# Patient Record
Sex: Male | Born: 1975
Health system: Southern US, Community
[De-identification: ages and names within clinical notes are randomized; demographics above are authoritative.]

## PROBLEM LIST (undated history)

## (undated) HISTORY — PX: EYE SURGERY: SHX253

---

## 2001-12-28 ENCOUNTER — Emergency Department (HOSPITAL_COMMUNITY): Admission: EM | Admit: 2001-12-28 | Discharge: 2001-12-28 | Payer: Self-pay

## 2008-08-14 ENCOUNTER — Emergency Department (HOSPITAL_COMMUNITY): Admission: EM | Admit: 2008-08-14 | Discharge: 2008-08-14 | Payer: Self-pay | Admitting: Emergency Medicine

## 2008-08-17 ENCOUNTER — Emergency Department (HOSPITAL_COMMUNITY): Admission: EM | Admit: 2008-08-17 | Discharge: 2008-08-17 | Payer: Self-pay | Admitting: Emergency Medicine

## 2010-02-21 ENCOUNTER — Emergency Department (HOSPITAL_COMMUNITY): Admission: EM | Admit: 2010-02-21 | Discharge: 2010-02-21 | Payer: Self-pay | Admitting: Emergency Medicine

## 2010-12-29 LAB — URINALYSIS, ROUTINE W REFLEX MICROSCOPIC
Bilirubin Urine: NEGATIVE
Glucose, UA: NEGATIVE mg/dL
Ketones, ur: NEGATIVE mg/dL
Leukocytes, UA: NEGATIVE
Nitrite: NEGATIVE
Protein, ur: NEGATIVE mg/dL
Specific Gravity, Urine: 1.013 (ref 1.005–1.030)
Urobilinogen, UA: 1 mg/dL (ref 0.0–1.0)
pH: 6.5 (ref 5.0–8.0)

## 2010-12-29 LAB — URINE MICROSCOPIC-ADD ON

## 2010-12-29 LAB — HEMOCCULT GUIAC POC 1CARD (OFFICE): Fecal Occult Bld: NEGATIVE

## 2011-07-13 LAB — URINALYSIS, ROUTINE W REFLEX MICROSCOPIC
Bilirubin Urine: NEGATIVE
Glucose, UA: NEGATIVE
Ketones, ur: NEGATIVE
Leukocytes, UA: NEGATIVE
Nitrite: NEGATIVE
Protein, ur: NEGATIVE
Specific Gravity, Urine: 1.008
Urobilinogen, UA: 0.2
pH: 6

## 2011-07-13 LAB — URINE MICROSCOPIC-ADD ON

## 2011-09-06 ENCOUNTER — Emergency Department (HOSPITAL_COMMUNITY)
Admission: EM | Admit: 2011-09-06 | Discharge: 2011-09-06 | Disposition: A | Discharge status: Home / Self Care | Payer: Self-pay | Source: Home / Self Care | Attending: Family Medicine | Admitting: Family Medicine

## 2011-09-06 DIAGNOSIS — S43409A Unspecified sprain of unspecified shoulder joint, initial encounter: Secondary | ICD-10-CM

## 2011-09-06 DIAGNOSIS — IMO0002 Reserved for concepts with insufficient information to code with codable children: Secondary | ICD-10-CM

## 2011-09-06 MED ORDER — PREDNISONE (PAK) 10 MG PO TABS: ORAL_TABLET | ORAL | Status: DC

## 2011-09-06 NOTE — ED Provider Notes (Signed)
History     CSN: [id number] Arrival date & time: 09/06/2011  2:50 PM   First MD Initiated Contact with Patient 09/06/11 1403      Chief Complaint  Patient presents with  . Shoulder Pain    (Consider location/radiation/quality/duration/timing/severity/associated sxs/prior treatment) Patient is a 35 y.o. male presenting with shoulder pain. The history is provided by the patient.  Shoulder Pain This is a new problem. The current episode started more than 1 week ago. The problem occurs constantly. The problem has not changed since onset. pain worse with certain motion. No numbness or tingling. No swelling. Denies specific injury.   History reviewed. No pertinent past medical history.  History reviewed. No pertinent past surgical history.  History reviewed. No pertinent family history.  History  Substance Use Topics  . Smoking status: Current Everyday Smoker -- 0.5 packs/day  . Smokeless tobacco: Not on file  . Alcohol Use: Yes     occasional      Review of Systems  Constitutional: Negative.   HENT: Negative.   Eyes: Negative.   Respiratory: Negative.   Cardiovascular: Negative.   Gastrointestinal: Negative.   Genitourinary: Negative.   Neurological: Negative.     Allergies  Review of patient's allergies indicates no known allergies.  Home Medications   Current Outpatient Rx  Name Route Sig Dispense Refill  . PREDNISONE (PAK) 10 MG PO TABS  6 day pack , take as directed with food 21 tablet 0    BP 142/87  Pulse 74  Temp(Src) 98.5 F (36.9 C) (Oral)  Resp 16  SpO2 97%  Physical Exam  Nursing note and vitals reviewed. Constitutional: He appears well-developed and well-nourished. No distress.  HENT:  Head: Normocephalic.  Neck: Normal range of motion. Neck supple.  Cardiovascular: Normal rate and regular rhythm.   Pulmonary/Chest: Effort normal and breath sounds normal.  Musculoskeletal:       Pain over the bicipital groove righ humerous. Pain with  supination of the forearm and flexion of the forearm. N/v intact.     ED Course  Procedures (including critical care time)  Labs Reviewed - No data to display No results found.   1. Shoulder sprain       MDM          Randa Spike, MD 09/06/11 [phone]

## 2011-09-06 NOTE — ED Notes (Signed)
C/o pain and limited ROM in rt shoulder for 2 months.  Denies known injury.  States sx worse in last few days.  Reports similar sx before but they always resolve.

## 2011-09-18 ENCOUNTER — Encounter (HOSPITAL_COMMUNITY): Payer: Self-pay | Admitting: *Deleted

## 2011-09-18 ENCOUNTER — Emergency Department (HOSPITAL_COMMUNITY)
Admission: EM | Admit: 2011-09-18 | Discharge: 2011-09-18 | Disposition: A | Discharge status: Home / Self Care | Payer: Self-pay | Attending: Emergency Medicine | Admitting: Emergency Medicine

## 2011-09-18 DIAGNOSIS — K648 Other hemorrhoids: Secondary | ICD-10-CM | POA: Insufficient documentation

## 2011-09-18 DIAGNOSIS — K6289 Other specified diseases of anus and rectum: Secondary | ICD-10-CM | POA: Insufficient documentation

## 2011-09-18 MED ORDER — HYDROCORTISONE ACETATE 25 MG RE SUPP: 25.0000 mg | Freq: Two times a day (BID) | RECTAL | Status: DC

## 2011-09-18 MED ORDER — PRAMOXINE HCL 1 % RE FOAM: RECTAL | Status: DC | PRN

## 2011-09-18 MED ORDER — OXYCODONE-ACETAMINOPHEN 5-325 MG PO TABS: 1.0000 | ORAL_TABLET | ORAL | Status: AC | PRN

## 2011-09-18 MED ORDER — OXYCODONE-ACETAMINOPHEN 5-325 MG PO TABS
1.0000 | ORAL_TABLET | Freq: Once | ORAL | Status: AC
  Administered 2011-09-18: 1 via ORAL
  Filled 2011-09-18: qty 1

## 2011-09-18 NOTE — ED Provider Notes (Signed)
History     CSN: [id number] Arrival date & time: 09/18/2011  9:19 AM   First MD Initiated Contact with Patient 09/18/11 1014      Chief Complaint  Patient presents with  . Hemorrhoids    (Consider location/radiation/quality/duration/timing/severity/associated sxs/prior treatment) The history is provided by the patient.  Patient presents with rectal pain which started 3-4 days ago. States he has had hemorrhoids in the past and this feels similar; feels as if he has an internal, left sided hemorrhoid. He was apparently evaluated several years ago for this and had a questionably enlarged prostate; he was seen by [facility], where he was told his prostate is not enlarged.   He has not noted any gross blood per rectum. Pain worsens with BMs; last movement was this AM and was normal for him. Pt states he eats a good amount of fiber in his diet and usually has daily BMs. Has been using OTC Dulcolax suppositories for the past few days which have not been helping much. Denies abd pain, nausea, vomiting, diarrhea.  Past Medical History  Diagnosis Date  . Hemorrhoids     History reviewed. No pertinent past surgical history.  No family history on file.  History  Substance Use Topics  . Smoking status: Current Everyday Smoker -- 0.5 packs/day  . Smokeless tobacco: Not on file  . Alcohol Use: Yes     occasional      Review of Systems  Constitutional: Negative.   Respiratory: Negative for shortness of breath.   Cardiovascular: Negative for chest pain.  Gastrointestinal: Positive for rectal pain. Negative for nausea, vomiting, abdominal pain, diarrhea, constipation, blood in stool, abdominal distention and anal bleeding.  Genitourinary: Negative for dysuria, decreased urine volume, scrotal swelling, difficulty urinating and testicular pain.  Musculoskeletal: Negative for myalgias and arthralgias.  Skin: Negative for color change and rash.  Neurological: Negative for dizziness and  weakness.    Allergies  Review of patient's allergies indicates no known allergies.  Home Medications   Current Outpatient Rx  Name Route Sig Dispense Refill  . BISACODYL 10 MG RE SUPP Rectal Place 10 mg rectally as needed. constipation     . IBUPROFEN 200 MG PO TABS Oral Take 200 mg by mouth every 6 (six) hours as needed. pain       BP 124/68  Pulse 80  Temp 98.9 F (37.2 C)  Resp 20  SpO2 97%  Physical Exam  Nursing note and vitals reviewed. Constitutional: He is oriented to person, place, and time. He appears well-developed and well-nourished. No distress.       Pt lying on left side, appears uncomfortable  HENT:  Head: Normocephalic and atraumatic.  Right Ear: External ear normal.  Left Ear: External ear normal.  Neck: Normal range of motion.  Cardiovascular: Normal rate, regular rhythm and normal heart sounds.   Pulmonary/Chest: Effort normal and breath sounds normal. No respiratory distress.  Abdominal: Soft. Bowel sounds are normal. He exhibits no distension and no mass. There is no tenderness. There is no rebound and no guarding.  Genitourinary: Rectal exam shows internal hemorrhoid and tenderness. Rectal exam shows no external hemorrhoid, no fissure, no mass and anal tone normal. Guaiac negative stool.  Neurological: He is alert and oriented to person, place, and time.  Skin: Skin is warm and dry. No rash noted. He is not diaphoretic.    ED Course  Procedures (including critical care time)  Labs Reviewed - No data to display No results found. Hemoccult  results not crossing over, but negative per tech  1. Internal hemorrhoids without complication       MDM  Internal hemorrhoids on rectal exam; exam painful. Pt will be treated symptomatically. Encouraged him to push high fiber diet and fluids over next several days; will tx with proctofoam and anusol. Discussed reasons to return to ED. Pt verbalized understanding and agreed to plan.        Grant Fontana, [place] 09/18/11 [phone]

## 2011-09-18 NOTE — ED Notes (Signed)
Pt has history of hemorrhoids, states they feel internal on left side, last BM this am

## 2011-09-20 LAB — OCCULT BLOOD, POC DEVICE: Fecal Occult Bld: NEGATIVE

## 2011-09-20 NOTE — ED Provider Notes (Signed)
Medical screening examination/treatment/procedure(s) were performed by non-physician practitioner and as supervising physician I was immediately available for consultation/collaboration.  Shelda Jakes, MD 09/20/11 [phone]

## 2011-09-21 ENCOUNTER — Encounter (HOSPITAL_COMMUNITY): Payer: Self-pay | Admitting: Emergency Medicine

## 2011-09-21 ENCOUNTER — Emergency Department (HOSPITAL_COMMUNITY)
Admission: EM | Admit: 2011-09-21 | Discharge: 2011-09-22 | Discharge status: Against Medical Advice | Payer: Self-pay | Attending: Emergency Medicine | Admitting: Emergency Medicine

## 2011-09-21 DIAGNOSIS — K612 Anorectal abscess: Secondary | ICD-10-CM

## 2011-09-21 DIAGNOSIS — K610 Anal abscess: Secondary | ICD-10-CM | POA: Diagnosis present

## 2011-09-21 MED ORDER — MORPHINE SULFATE 2 MG/ML IJ SOLN
INTRAMUSCULAR | Status: AC
  Filled 2011-09-21: qty 1

## 2011-09-21 MED ORDER — LIDOCAINE HCL 1 % IJ SOLN
INTRAMUSCULAR | Status: AC
  Filled 2011-09-21: qty 20

## 2011-09-21 MED ORDER — IBUPROFEN 200 MG PO TABS: 400.0000 mg | ORAL_TABLET | Freq: Four times a day (QID) | ORAL | Status: DC | PRN

## 2011-09-21 MED ORDER — HYDROMORPHONE HCL PF 1 MG/ML IJ SOLN: 1.0000 mg | Freq: Once | INTRAMUSCULAR | Status: DC

## 2011-09-21 MED ORDER — LIDOCAINE-EPINEPHRINE 1 %-1:100000 IJ SOLN
10.0000 mL | Freq: Once | INTRAMUSCULAR | Status: DC
  Filled 2011-09-21: qty 10

## 2011-09-21 MED ORDER — MORPHINE SULFATE 2 MG/ML IJ SOLN
2.0000 mg | Freq: Once | INTRAMUSCULAR | Status: AC
  Administered 2011-09-21: 2 mg via INTRAVENOUS

## 2011-09-21 MED ORDER — LIDOCAINE-EPINEPHRINE 1 %-1:100000 IJ SOLN
20.0000 mL | Freq: Once | INTRAMUSCULAR | Status: DC
  Filled 2011-09-21: qty 20

## 2011-09-21 MED ORDER — HYDROMORPHONE HCL PF 2 MG/ML IJ SOLN
2.0000 mg | Freq: Once | INTRAMUSCULAR | Status: AC
  Administered 2011-09-21: 2 mg via INTRAVENOUS
  Filled 2011-09-21: qty 1

## 2011-09-21 MED ORDER — OXYCODONE-ACETAMINOPHEN 5-325 MG PO TABS: 1.0000 | ORAL_TABLET | ORAL | Status: DC | PRN

## 2011-09-21 NOTE — Op Note (Signed)
NAMERORAN, WEGNER NO.:  [id number]  MEDICAL RECORD NO.:  [id number]  LOCATION:  WOTF                         FACILITY:  [facility]  PHYSICIAN:  Anselm Pancoast. Weatherly, M.D.DATE OF BIRTH:  01/16/1976  DATE OF PROCEDURE:  09/21/2011 DATE OF DISCHARGE:                              OPERATIVE REPORT   PREOPERATIVE DIAGNOSIS:  Perianal abscess.  POSTOPERATIVE DIAGNOSIS:  Perianal abscess.Marland Kitchen  PROCEDURE PERFORMED:  Incision and drainage of perianal abscess.  PROVIDER:  Brayton El, PA-C/William J. Zachery Dakins, M.D.  ANESTHESIA:  2% lidocaine local.  DESCRIPTION OF PROCEDURE:  Mr. Loman is a 35 year old gentleman, who was seen in the emergency room for rectal pain.  He was noted to have a large perianal abscess.  After informed consent was obtained, the patient was placed in a jack-knife position for procedure.  Sterile prep and drape was carried out in the usual manner.  2% lidocaine was locally infiltrated into the abscess cavity, achieving adequate anesthesia.  The abscess was then incised with a sterile #11 blade yielding copious amounts of purulent fluid and pus.  Culture was obtained.  The wound was broken up with any septations via sterile technique and more purulence was expressed.  Once it was felt that the abscess was adequately drained, quarter-inch NuGauze packing was placed into the wound and sterile dressing was placed.  The patient tolerated the procedure well. There was minimal blood loss.  Postop instructions have been given to him and the remainder of his treatment plan is outlined in his consultation note.     Brayton El, PA-C   ______________________________ Anselm Pancoast. Zachery Dakins, M.D.    KB/MEDQ  D:  09/21/2011  T:  09/21/2011  Job:  [id number]

## 2011-09-21 NOTE — ED Notes (Signed)
Pt states was seen Sat in ER and told he had hemorrhoids, states no improvement since was seen, states medications"arent working".

## 2011-09-21 NOTE — Consult Note (Signed)
Reason for Consult:Paerianal pain and swelling Referring Physician: Ritch   HPI: Andrew Gates is an 35 y.o. male with c/o anal pain. Denies trauma. Has a history of hemorrhoids. Was recently seen about a little over a week ago for shoulder pain and put on prednisone 6day taper. About the end of his taper, he felt some worsening anal pain. He came to the ER the other night and was treated for an int hemorrhoid. However he has not responded to topical treatment and he feels as if there is more swelling and it has become very painful. He has not noticed any drainage. Denies bleeding. Denies abd pain, fever.  Past Medical History:  Past Medical History  Diagnosis Date  . Hemorrhoids     Surgical History: History reviewed. No pertinent past surgical history.  Family History: History reviewed. No pertinent family history.  Social History:  reports that he has been smoking.  He does not have any smokeless tobacco history on file. He reports that he drinks alcohol. He reports that he does not use illicit drugs.  Allergies: No Known Allergies  Medications: I have reviewed the patient's current medications.  ROS: See HPI for pertinent findings, otherwise complete 10 system review negative.  Physical Exam: Blood pressure 136/78, pulse 105, temperature 98.7 F (37.1 C), temperature source Oral, resp. rate 26, SpO2 100.00%.  General Appearance:  Alert, cooperative, no distress, appears stated age  Lungs:   Clear to auscultation bilaterally, no w/r/r, respirations unlabored without use of accessory muscles.  Chest Wall:  No tenderness or deformity  Heart:  Regular rate and rhythm, S1, S2 normal, no murmur, rub or gallop. Carotids 2+ without bruit.  Abdomen:   Soft, non-tender, non distended. Bowel sounds active all four quadrants,  no masses, no organomegaly.  Rectal:  Obvious external lesion/mass, soft and fluctuant and very tender. Not blue/purple as expected for thrombosed hem.     Labs: CBC No results found for this basename: WBC:2,HGB:2,HCT:2,PLT:2 in the last 72 hours MET No results found for this basename: NA:2,K:2,CL:2,CO2:2,GLUCOSE:2,BUN:2,CREATININE:2,CALCIUM:2 in the last 72 hours No results found for this basename: PROT,ALBUMIN,AST,ALT,ALKPHOS,BILITOT,BILIDIR,IBILI,LIPASE in the last 72 hours PT/INR No results found for this basename: LABPROT:2,INR:2 in the last 72 hours ABG No results found for this basename: PHART:2,PCO2:2,PO2:2,HCO3:2 in the last 72 hours    No results found.  Assessment/Plan: Principal Problem:  *Perianal abscess Needs I&D-done at bedside. See Op Note. Instructions given for soaks, dressing. Bowel regimen. Check cultures, abx.   Marrie Chandra J 09/21/2011, 3:52 PM

## 2011-09-21 NOTE — ED Provider Notes (Signed)
History     CSN: [id number] Arrival date & time: 09/21/2011 12:15 PM   First MD Initiated Contact with Patient 09/21/11 1433      Chief Complaint  Patient presents with  . Rectal Pain   HPI Pt returns today with continued, worsening pain from left sided hemorrhoid.  Patient reports that his pain has been steadily increasing since seen in ER.  Pain is in the rectal area, is causing problems with urination as a result.  No fevers/chills, no nausea/vomiting, no BM since two days ago.  Has been trying to increase fiber intake, use hydrocortisone suppositories and using proctofoam, none of which have been having significant effect.  Past Medical History  Diagnosis Date  . Hemorrhoids     History reviewed. No pertinent past surgical history.  No family history on file.  History  Substance Use Topics  . Smoking status: Current Everyday Smoker -- 0.5 packs/day  . Smokeless tobacco: Not on file  . Alcohol Use: Yes     occasional      Review of Systems  Constitutional: Negative.   HENT: Negative.   Eyes: Negative.   Respiratory: Negative.   Cardiovascular: Negative.   Gastrointestinal: Positive for rectal pain. Negative for vomiting, abdominal pain, diarrhea, constipation, blood in stool and anal bleeding.  Genitourinary: Positive for difficulty urinating. Negative for dysuria, flank pain, discharge, enuresis and penile pain.  Musculoskeletal: Negative.   Skin: Negative.   Neurological: Negative.   Hematological: Negative.     Allergies  Review of patient's allergies indicates no known allergies.  Home Medications   Current Outpatient Rx  Name Route Sig Dispense Refill  . HYDROCORTISONE ACETATE 25 MG RE SUPP Rectal Place 1 suppository (25 mg total) rectally 2 (two) times daily. 12 suppository 0  . IBUPROFEN 200 MG PO TABS Oral Take 400 mg by mouth every 6 (six) hours as needed. For pain    . OXYCODONE-ACETAMINOPHEN 5-325 MG PO TABS Oral Take 1 tablet by mouth every 4  (four) hours as needed for pain. 6 tablet 0  . PRAMOXINE HCL 1 % RE FOAM Rectal Place rectally every 2 (two) hours as needed for hemorrhoids. 15 g 0    BP 136/78  Pulse 105  Temp(Src) 98.7 F (37.1 C) (Oral)  Resp 26  SpO2 100%  Physical Exam  Constitutional: He is oriented to person, place, and time. He appears well-developed and well-nourished. He appears distressed.  HENT:  Head: Normocephalic and atraumatic.  Eyes: Conjunctivae and EOM are normal.  Neck: Normal range of motion. Neck supple.  Cardiovascular: Normal rate, regular rhythm and normal heart sounds.   Pulmonary/Chest: Effort normal and breath sounds normal.  Abdominal: Soft.  Genitourinary:       Exquisite tenderness in the peri-rectal area.  There is a 2x2 cm area of swelling on the left posterior portion of the peri-anal skin.  Skin is somewhat red with a central darkening.  Firm.  No surrounding streaking.  Unable to perform full DRE secondary to pain.  Musculoskeletal: Normal range of motion.  Neurological: He is alert and oriented to person, place, and time.  Skin: Skin is warm and dry.    ED Course  Procedures (including critical care time)  Labs Reviewed - No data to display No results found.   No diagnosis found.    MDM  Pt with thrombosed external hemorrhoid vs peri-rectal abscess.  Have called general surgery to evaluate and help decide on best treatment plan.  Patient signed out to  oncoming team.  Will await final dispo from general surgery.  They plan to open likely peri-rectal abscess.  Majel Homer, MD 09/21/11 [phone]

## 2011-09-22 HISTORY — PX: INCISION AND DRAINAGE PERIRECTAL ABSCESS: SHX1804

## 2011-09-23 NOTE — ED Provider Notes (Signed)
I saw and evaluated the patient, reviewed the resident's note and I agree with the findings and plan. Patient has apparently anal abscess. Seen in drain in the ER by surgery.  Juliet Rude. Rubin Payor, MD 09/23/11 [phone]

## 2011-09-25 LAB — WOUND CULTURE

## 2012-03-28 ENCOUNTER — Emergency Department (HOSPITAL_COMMUNITY)
Admission: EM | Admit: 2012-03-28 | Discharge: 2012-03-28 | Disposition: A | Discharge status: Home / Self Care | Payer: Self-pay | Attending: Emergency Medicine | Admitting: Emergency Medicine

## 2012-03-28 ENCOUNTER — Encounter (HOSPITAL_COMMUNITY): Payer: Self-pay | Admitting: *Deleted

## 2012-03-28 DIAGNOSIS — K6289 Other specified diseases of anus and rectum: Secondary | ICD-10-CM | POA: Insufficient documentation

## 2012-03-28 DIAGNOSIS — F17200 Nicotine dependence, unspecified, uncomplicated: Secondary | ICD-10-CM | POA: Insufficient documentation

## 2012-03-28 MED ORDER — HYDROCODONE-ACETAMINOPHEN 5-325 MG PO TABS: 1.0000 | ORAL_TABLET | Freq: Four times a day (QID) | ORAL | Status: DC | PRN

## 2012-03-28 MED ORDER — POLYETHYLENE GLYCOL 3350 17 GM/SCOOP PO POWD: 17.0000 g | Freq: Every day | ORAL | Status: DC

## 2012-03-28 MED ORDER — HYDROMORPHONE HCL PF 2 MG/ML IJ SOLN
1.0000 mg | Freq: Once | INTRAMUSCULAR | Status: AC
  Administered 2012-03-28: 1 mg via INTRAMUSCULAR
  Filled 2012-03-28: qty 1

## 2012-03-28 NOTE — ED Provider Notes (Signed)
History     CSN: [id number]  Arrival date & time 03/28/12  1830   First MD Initiated Contact with Patient 03/28/12 2131      10:04 PM HPI Patient reports for 2 days has had rectal pain. Reports history of internal hemorrhoids. States similar symptoms in the past that gradually resolved without any medication. Denies constipation or excessive straining. Denies abdominal pain. Reports extremely painful symptom. Reports no improvement with a hot sitz bath. States his last visit he was prescribed a suppository which did not help.  The history is provided by the patient.    Past Medical History  Diagnosis Date  . Hemorrhoids     History reviewed. No pertinent past surgical history.  History reviewed. No pertinent family history.  History  Substance Use Topics  . Smoking status: Current Everyday Smoker -- 0.5 packs/day  . Smokeless tobacco: Not on file  . Alcohol Use: Yes     occasional      Review of Systems  Constitutional: Negative for fever and chills.  Gastrointestinal: Positive for rectal pain (hemorrhoid). Negative for nausea, vomiting, abdominal pain, diarrhea, constipation, blood in stool and anal bleeding.  All other systems reviewed and are negative.    Allergies  Review of patient's allergies indicates no known allergies.  Home Medications   Current Outpatient Rx  Name Route Sig Dispense Refill  . IBUPROFEN 200 MG PO TABS Oral Take 400 mg by mouth every 6 (six) hours as needed. For pain      BP 129/76  Pulse 74  Temp 98.5 F (36.9 C) (Oral)  Resp 18  SpO2 100%  Physical Exam  Vitals reviewed. Constitutional: He is oriented to person, place, and time. He appears well-developed and well-nourished.  HENT:  Head: Normocephalic and atraumatic.  Eyes: Pupils are equal, round, and reactive to light.  Genitourinary: Prostate normal. Rectal exam shows tenderness and anal tone abnormal. Rectal exam shows no external hemorrhoid and no internal hemorrhoid (No  palpable internal hemorrhoids).  Neurological: He is alert and oriented to person, place, and time.  Skin: Skin is warm and dry. No rash noted. No erythema. No pallor.  Psychiatric: He has a normal mood and affect. His behavior is normal.    ED Course  Procedures    MDM   No internal hemorrhoids palpated. Patient reports last time he was prescribed Anusol rectal cream without relief. States he does not is again since the cost and $75. Recommended close followup with gastroenterologist to discussed banding of hemorrhoid. Patient voices understanding and is ready for discharge. Will prescribe analgesics and stool softeners. Advised that analgesics may cause constipation, which in turn causes straining and worsening hemorrhoids. Patient voices understanding states he was unable to rest at all last night due to severe pain.        Thomasene Lot, PA-C 03/28/12 2224

## 2012-03-28 NOTE — ED Notes (Signed)
Pt reports hemorrhoids pain x2 days - pt w/ hx of same. Pt states he has been having regular bowel movements daily sometiems x2 a day. Pt denies straining or hard stools. Pt denies noting any blood in stool. Pt states he was unable to go to work today d/t pain.

## 2012-03-29 NOTE — ED Provider Notes (Signed)
Medical screening examination/treatment/procedure(s) were performed by non-physician practitioner and as supervising physician I was immediately available for consultation/collaboration.   Rolan Bucco, MD 03/29/12 [phone]

## 2012-03-30 ENCOUNTER — Encounter (HOSPITAL_COMMUNITY): Admission: EM | Disposition: A | Discharge status: Home / Self Care | Payer: Self-pay | Source: Home / Self Care

## 2012-03-30 ENCOUNTER — Encounter (HOSPITAL_COMMUNITY): Payer: Self-pay | Admitting: Emergency Medicine

## 2012-03-30 ENCOUNTER — Inpatient Hospital Stay (HOSPITAL_COMMUNITY)
Admission: EM | Admit: 2012-03-30 | Discharge: 2012-04-01 | DRG: 349 | Disposition: A | Discharge status: Home / Self Care | Payer: Self-pay | Attending: Surgery | Admitting: Surgery

## 2012-03-30 ENCOUNTER — Emergency Department (HOSPITAL_COMMUNITY): Payer: Self-pay

## 2012-03-30 ENCOUNTER — Encounter (HOSPITAL_COMMUNITY): Payer: Self-pay | Admitting: Anesthesiology

## 2012-03-30 ENCOUNTER — Observation Stay (HOSPITAL_COMMUNITY): Payer: Self-pay | Admitting: Anesthesiology

## 2012-03-30 DIAGNOSIS — Z79899 Other long term (current) drug therapy: Secondary | ICD-10-CM

## 2012-03-30 DIAGNOSIS — K612 Anorectal abscess: Secondary | ICD-10-CM

## 2012-03-30 DIAGNOSIS — K6289 Other specified diseases of anus and rectum: Secondary | ICD-10-CM

## 2012-03-30 DIAGNOSIS — F17200 Nicotine dependence, unspecified, uncomplicated: Secondary | ICD-10-CM | POA: Diagnosis present

## 2012-03-30 HISTORY — PX: INCISION AND DRAINAGE PERIRECTAL ABSCESS: SHX1804

## 2012-03-30 LAB — URINALYSIS, ROUTINE W REFLEX MICROSCOPIC
Bilirubin Urine: NEGATIVE
Leukocytes, UA: NEGATIVE
Nitrite: NEGATIVE
Specific Gravity, Urine: 1.019 (ref 1.005–1.030)
Urobilinogen, UA: 0.2 mg/dL (ref 0.0–1.0)

## 2012-03-30 LAB — CBC
HCT: 43.5 % (ref 39.0–52.0)
Hemoglobin: 14.6 g/dL (ref 13.0–17.0)
MCH: 26.3 pg (ref 26.0–34.0)
MCHC: 33.6 g/dL (ref 30.0–36.0)
MCV: 78.4 fL (ref 78.0–100.0)
RDW: 14.5 % (ref 11.5–15.5)

## 2012-03-30 LAB — SURGICAL PCR SCREEN: Staphylococcus aureus: NEGATIVE

## 2012-03-30 LAB — COMPREHENSIVE METABOLIC PANEL
Alkaline Phosphatase: 96 U/L (ref 39–117)
BUN: 6 mg/dL (ref 6–23)
Calcium: 9.7 mg/dL (ref 8.4–10.5)
Creatinine, Ser: 0.96 mg/dL (ref 0.50–1.35)
GFR calc Af Amer: >90 mL/min (ref >90)
Glucose, Bld: 88 mg/dL (ref 70–99)
Potassium: 3.5 mEq/L (ref 3.5–5.1)
Total Protein: 8.4 g/dL — ABNORMAL HIGH (ref 6.0–8.3)

## 2012-03-30 LAB — URINE MICROSCOPIC-ADD ON

## 2012-03-30 SURGERY — INCISION AND DRAINAGE, ABSCESS, PERIRECTAL
Anesthesia: General | Wound class: Dirty or Infected

## 2012-03-30 MED ORDER — KCL IN DEXTROSE-NACL 20-5-0.45 MEQ/L-%-% IV SOLN
INTRAVENOUS | Status: DC
  Administered 2012-03-30 – 2012-03-31 (×2): via INTRAVENOUS
  Filled 2012-03-30 (×4): qty 1000

## 2012-03-30 MED ORDER — LIDOCAINE-EPINEPHRINE 1 %-1:100000 IJ SOLN
INTRAMUSCULAR | Status: AC
  Filled 2012-03-30: qty 1

## 2012-03-30 MED ORDER — CIPROFLOXACIN IN D5W 400 MG/200ML IV SOLN
INTRAVENOUS | Status: AC
  Filled 2012-03-30: qty 200

## 2012-03-30 MED ORDER — DEXAMETHASONE SODIUM PHOSPHATE 10 MG/ML IJ SOLN
INTRAMUSCULAR | Status: DC | PRN
  Administered 2012-03-30: 10 mg via INTRAVENOUS

## 2012-03-30 MED ORDER — MIDAZOLAM HCL 5 MG/5ML IJ SOLN
INTRAMUSCULAR | Status: DC | PRN
  Administered 2012-03-30: 2 mg via INTRAVENOUS

## 2012-03-30 MED ORDER — CIPROFLOXACIN IN D5W 400 MG/200ML IV SOLN
400.0000 mg | Freq: Two times a day (BID) | INTRAVENOUS | Status: DC
  Administered 2012-03-31 – 2012-04-01 (×3): 400 mg via INTRAVENOUS
  Filled 2012-03-30 (×3): qty 200

## 2012-03-30 MED ORDER — METRONIDAZOLE IN NACL 5-0.79 MG/ML-% IV SOLN
INTRAVENOUS | Status: DC | PRN
  Administered 2012-03-30: 500 mg via INTRAVENOUS

## 2012-03-30 MED ORDER — ONDANSETRON HCL 4 MG/2ML IJ SOLN
INTRAMUSCULAR | Status: DC | PRN
  Administered 2012-03-30: 4 mg via INTRAVENOUS

## 2012-03-30 MED ORDER — MUPIROCIN 2 % EX OINT
TOPICAL_OINTMENT | CUTANEOUS | Status: AC
  Filled 2012-03-30: qty 22

## 2012-03-30 MED ORDER — ONDANSETRON HCL 4 MG/2ML IJ SOLN: 4.0000 mg | Freq: Four times a day (QID) | INTRAMUSCULAR | Status: DC | PRN

## 2012-03-30 MED ORDER — ACETAMINOPHEN 10 MG/ML IV SOLN
INTRAVENOUS | Status: DC | PRN
  Administered 2012-03-30: 1000 mg via INTRAVENOUS

## 2012-03-30 MED ORDER — SODIUM CHLORIDE 0.9 % IV SOLN: INTRAVENOUS | Status: DC

## 2012-03-30 MED ORDER — MORPHINE SULFATE 4 MG/ML IJ SOLN
4.0000 mg | INTRAMUSCULAR | Status: DC | PRN
  Administered 2012-03-30 – 2012-03-31 (×2): 4 mg via INTRAVENOUS
  Filled 2012-03-30 (×2): qty 1

## 2012-03-30 MED ORDER — IOHEXOL 300 MG/ML  SOLN
100.0000 mL | Freq: Once | INTRAMUSCULAR | Status: AC | PRN
  Administered 2012-03-30: 100 mL via INTRAVENOUS

## 2012-03-30 MED ORDER — HYDROMORPHONE HCL PF 1 MG/ML IJ SOLN
1.0000 mg | INTRAMUSCULAR | Status: DC | PRN
  Administered 2012-03-30 – 2012-03-31 (×9): 1 mg via INTRAVENOUS
  Administered 2012-04-01: 1.5 mg via INTRAVENOUS
  Administered 2012-04-01 (×2): 1 mg via INTRAVENOUS
  Filled 2012-03-30 (×4): qty 1
  Filled 2012-03-30: qty 2
  Filled 2012-03-30 (×7): qty 1

## 2012-03-30 MED ORDER — HYDROMORPHONE HCL PF 1 MG/ML IJ SOLN
1.0000 mg | Freq: Once | INTRAMUSCULAR | Status: AC
  Administered 2012-03-30: 1 mg via INTRAVENOUS
  Filled 2012-03-30: qty 1

## 2012-03-30 MED ORDER — HYDROMORPHONE HCL PF 1 MG/ML IJ SOLN
INTRAMUSCULAR | Status: AC
  Administered 2012-03-30: 1 mg
  Filled 2012-03-30: qty 1

## 2012-03-30 MED ORDER — BUPIVACAINE HCL (PF) 0.25 % IJ SOLN
INTRAMUSCULAR | Status: AC
  Filled 2012-03-30: qty 30

## 2012-03-30 MED ORDER — FENTANYL CITRATE 0.05 MG/ML IJ SOLN
INTRAMUSCULAR | Status: DC | PRN
Start: 2012-03-30 — End: 2012-03-30
  Administered 2012-03-30: 100 ug via INTRAVENOUS

## 2012-03-30 MED ORDER — CIPROFLOXACIN IN D5W 400 MG/200ML IV SOLN
400.0000 mg | Freq: Two times a day (BID) | INTRAVENOUS | Status: DC
  Filled 2012-03-30: qty 200

## 2012-03-30 MED ORDER — METRONIDAZOLE IN NACL 5-0.79 MG/ML-% IV SOLN
500.0000 mg | Freq: Three times a day (TID) | INTRAVENOUS | Status: DC
  Administered 2012-03-31 – 2012-04-01 (×5): 500 mg via INTRAVENOUS
  Filled 2012-03-30 (×6): qty 100

## 2012-03-30 MED ORDER — PROMETHAZINE HCL 25 MG/ML IJ SOLN: 6.2500 mg | INTRAMUSCULAR | Status: DC | PRN

## 2012-03-30 MED ORDER — ACETAMINOPHEN 10 MG/ML IV SOLN
INTRAVENOUS | Status: AC
  Filled 2012-03-30: qty 100

## 2012-03-30 MED ORDER — PROPOFOL 10 MG/ML IV BOLUS
INTRAVENOUS | Status: DC | PRN
  Administered 2012-03-30: 180 mg via INTRAVENOUS

## 2012-03-30 MED ORDER — ACETAMINOPHEN 325 MG PO TABS: 650.0000 mg | ORAL_TABLET | Freq: Four times a day (QID) | ORAL | Status: DC | PRN

## 2012-03-30 MED ORDER — ACETAMINOPHEN 650 MG RE SUPP: 650.0000 mg | Freq: Four times a day (QID) | RECTAL | Status: DC | PRN

## 2012-03-30 MED ORDER — 0.9 % SODIUM CHLORIDE (POUR BTL) OPTIME
TOPICAL | Status: DC | PRN
  Administered 2012-03-30: 1000 mL

## 2012-03-30 MED ORDER — ONDANSETRON HCL 4 MG PO TABS: 4.0000 mg | ORAL_TABLET | Freq: Four times a day (QID) | ORAL | Status: DC | PRN

## 2012-03-30 MED ORDER — BUPIVACAINE-EPINEPHRINE PF 0.25-1:200000 % IJ SOLN
INTRAMUSCULAR | Status: AC
  Filled 2012-03-30: qty 30

## 2012-03-30 MED ORDER — HYDROCODONE-ACETAMINOPHEN 5-325 MG PO TABS
1.0000 | ORAL_TABLET | ORAL | Status: DC | PRN
  Administered 2012-03-30: 2 via ORAL
  Administered 2012-03-31: 1 via ORAL
  Administered 2012-04-01: 2 via ORAL
  Filled 2012-03-30: qty 2
  Filled 2012-03-30: qty 1
  Filled 2012-03-30: qty 2

## 2012-03-30 MED ORDER — SUCCINYLCHOLINE CHLORIDE 20 MG/ML IJ SOLN
INTRAMUSCULAR | Status: DC | PRN
  Administered 2012-03-30: 100 mg via INTRAVENOUS

## 2012-03-30 MED ORDER — BUPIVACAINE-EPINEPHRINE 0.25% -1:200000 IJ SOLN
INTRAMUSCULAR | Status: DC | PRN
  Administered 2012-03-30: 30 mL

## 2012-03-30 MED ORDER — LACTATED RINGERS IV SOLN
INTRAVENOUS | Status: DC | PRN
  Administered 2012-03-30 (×2): via INTRAVENOUS

## 2012-03-30 MED ORDER — METRONIDAZOLE IN NACL 5-0.79 MG/ML-% IV SOLN
INTRAVENOUS | Status: AC
  Filled 2012-03-30: qty 100

## 2012-03-30 MED ORDER — HYDROMORPHONE HCL PF 1 MG/ML IJ SOLN: 0.2500 mg | INTRAMUSCULAR | Status: DC | PRN

## 2012-03-30 MED ORDER — METRONIDAZOLE IN NACL 5-0.79 MG/ML-% IV SOLN
500.0000 mg | Freq: Three times a day (TID) | INTRAVENOUS | Status: DC
  Filled 2012-03-30: qty 100

## 2012-03-30 MED ORDER — CIPROFLOXACIN IN D5W 400 MG/200ML IV SOLN
INTRAVENOUS | Status: DC | PRN
  Administered 2012-03-30: 400 mg via INTRAVENOUS

## 2012-03-30 SURGICAL SUPPLY — 36 items
BLADE SURG 15 STRL LF DISP TIS (BLADE) ×1 IMPLANT
BLADE SURG 15 STRL SS (BLADE) ×2
BRIEF STRETCH FOR OB PAD LRG (UNDERPADS AND DIAPERS) ×1 IMPLANT
CANISTER SUCTION 2500CC (MISCELLANEOUS) ×2 IMPLANT
CLOTH BEACON ORANGE TIMEOUT ST (SAFETY) ×2 IMPLANT
COVER SURGICAL LIGHT HANDLE (MISCELLANEOUS) ×2 IMPLANT
DECANTER SPIKE VIAL GLASS SM (MISCELLANEOUS) ×2 IMPLANT
DRAIN PENROSE 18X1/2 LTX STRL (DRAIN) IMPLANT
DRAPE LAPAROTOMY T 102X78X121 (DRAPES) IMPLANT
DRAPE LG THREE QUARTER DISP (DRAPES) ×1 IMPLANT
ELECT CAUTERY BLADE 6.4 (BLADE) ×2 IMPLANT
ELECT REM PT RETURN 9FT ADLT (ELECTROSURGICAL) ×2
ELECTRODE REM PT RTRN 9FT ADLT (ELECTROSURGICAL) ×1 IMPLANT
GAUZE PACKING 1/2 X5 YD (GAUZE/BANDAGES/DRESSINGS) ×1 IMPLANT
GAUZE SPONGE 4X4 16PLY XRAY LF (GAUZE/BANDAGES/DRESSINGS) ×2 IMPLANT
GLOVE BIOGEL PI IND STRL 7.0 (GLOVE) ×1 IMPLANT
GLOVE BIOGEL PI INDICATOR 7.0 (GLOVE) ×1
GLOVE SURG SS PI 7.5 STRL IVOR (GLOVE) ×8 IMPLANT
GOWN STRL NON-REIN LRG LVL3 (GOWN DISPOSABLE) ×2 IMPLANT
GOWN STRL REIN XL XLG (GOWN DISPOSABLE) ×4 IMPLANT
KIT BASIN OR (CUSTOM PROCEDURE TRAY) ×2 IMPLANT
LUBRICANT JELLY K Y 4OZ (MISCELLANEOUS) ×2 IMPLANT
NEEDLE HYPO 22GX1.5 SAFETY (NEEDLE) ×2 IMPLANT
NS IRRIG 1000ML POUR BTL (IV SOLUTION) ×2 IMPLANT
PACK BASIC VI WITH GOWN DISP (CUSTOM PROCEDURE TRAY) ×2 IMPLANT
PACK LITHOTOMY IV (CUSTOM PROCEDURE TRAY) ×1 IMPLANT
PENCIL BUTTON HOLSTER BLD 10FT (ELECTRODE) ×2 IMPLANT
SPONGE GAUZE 4X4 12PLY (GAUZE/BANDAGES/DRESSINGS) ×1 IMPLANT
SPONGE HEMORRHOID 8X3CM (HEMOSTASIS) ×1 IMPLANT
SPONGE SURGIFOAM ABS GEL 100 (HEMOSTASIS) IMPLANT
SUCTION FRAZIER 12FR DISP (SUCTIONS) ×1 IMPLANT
SYR BULB IRRIGATION 50ML (SYRINGE) ×1 IMPLANT
SYR CONTROL 10ML LL (SYRINGE) ×1 IMPLANT
TOWEL OR 17X26 10 PK STRL BLUE (TOWEL DISPOSABLE) ×2 IMPLANT
TUBE ANAEROBIC SPECIMEN COL (MISCELLANEOUS) ×1 IMPLANT
YANKAUER SUCT BULB TIP 10FT TU (MISCELLANEOUS) ×2 IMPLANT

## 2012-03-30 NOTE — Progress Notes (Signed)
Patient received as admission from emergency room.  Patient awake, alert, oriented x3.  Patient to be taken immediately to OR as per surgery.  Consents for procedure and for blood signed.  IV fluids infusing as ordered.  No further questions at this time.  Patient taken to OR via bed.

## 2012-03-30 NOTE — Transfer of Care (Signed)
Immediate Anesthesia Transfer of Care Note  Patient: Andrew Gates  Procedure(s) Performed: Procedure(s) (LRB): IRRIGATION AND DEBRIDEMENT PERIRECTAL ABSCESS (N/A)  Patient Location: PACU  Anesthesia Type: General  Level of Consciousness: awake, sedated and patient cooperative  Airway & Oxygen Therapy: Patient Spontanous Breathing and Patient connected to face mask oxygen  Post-op Assessment: Report given to PACU RN and Post -op Vital signs reviewed and stable  Post vital signs: Reviewed and stable  Complications: No apparent anesthesia complications

## 2012-03-30 NOTE — H&P (Signed)
He has no obvious external findings.  He has a good hx for perirectal abscess and a hx of prior I/D of perirectal abscess.  CT shows supralevator abscess extending to the sphincters.  I reviewed the scan with the Dr. Fredia Sorrow in IR and he thinks that this is too small to place a drain at 3cm but he could attempt aspiration.  I think that it would be best to try transrectal drainage if possible or at least transrectal aspiration to minimize the chance of fistula.  I discussed the options with the patient and his mother and the risks of infection, bleeding, pain, scarring, possible fistula, possible negative exam and need for future procedures, injury to sphincter muscles and incontinence and recurrent abscess or inadequate drainage.  They expressed understanding and desire to proceed with rectal EUA with possible aspiration or incision and drainage.

## 2012-03-30 NOTE — ED Provider Notes (Signed)
History     CSN: [id number]  Arrival date & time 03/30/12  0917   First MD Initiated Contact with Patient 03/30/12 [phone]      Chief Complaint  Patient presents with  . Hemorrhoids    (Consider location/radiation/quality/duration/timing/severity/associated sxs/prior treatment) HPI Mr. Miceli is a 36 year old male with history of. Anal abscess who presents today complaining of 10 out of 10 rectal pain. Patient was seen in the emergency department on June 18. At that time he was given pain medication which she has tried at home for relief. Patient reports taking a stool softener which appears to have been Colace as well as MiraLAX with Vicodin prescribed with no relief. He denies any nausea, vomiting, or fevers. Patient denies any increased bleeding from hemorrhoids. He does not have chest pain, shortness of breath, or lightheadedness. Patient reports that his pain is similar to the pain he had in December of 2012 when he had a perianal abscess that was drained in the emergency department by Dr. Zachery Dakins. Patient does endorse some urinary hesitancy with this but denies any urinary symptoms. His last bowel movement was 2 days ago. Patient is very tearful. There no other associated or modifying factors. Past Medical History  Diagnosis Date  . Hemorrhoids     History reviewed. No pertinent past surgical history.  History reviewed. No pertinent family history.  History  Substance Use Topics  . Smoking status: Current Everyday Smoker -- 0.5 packs/day  . Smokeless tobacco: Not on file  . Alcohol Use: Yes     occasional      Review of Systems  Constitutional: Negative.   HENT: Negative.   Eyes: Negative.   Respiratory: Negative.   Cardiovascular: Negative.   Gastrointestinal: Positive for rectal pain.  Genitourinary:       Urine hesitancy  Musculoskeletal: Negative.   Skin: Negative.   Neurological: Negative.   Hematological: Negative.   Psychiatric/Behavioral: Negative.   All  other systems reviewed and are negative.    Allergies  Review of patient's allergies indicates no known allergies.  Home Medications   Current Outpatient Rx  Name Route Sig Dispense Refill  . HYDROCODONE-ACETAMINOPHEN 5-325 MG PO TABS Oral Take 1-2 tablets by mouth every 6 (six) hours as needed. Pain    . IBUPROFEN 200 MG PO TABS Oral Take 400 mg by mouth every 6 (six) hours as needed. For pain    . POLYETHYLENE GLYCOL 3350 PO POWD Oral Take 17 g by mouth daily.      BP 143/89  Pulse 114  Temp 97.9 F (36.6 C) (Oral)  Resp 22  Ht 5\' 9"  (1.753 m)  Wt 210 lb (95.255 kg)  BMI 31.01 kg/m2  SpO2 100%  Physical Exam  Nursing note and vitals reviewed. GEN: Well-developed, well-nourished male in mild distress, tearful HEENT: Atraumatic, normocephalic.  EYES: PERRLA BL, no scleral icterus. NECK: Trachea midline, no meningismus CV: Mild tachycardia with regular rhythm. No murmurs, rubs, or gallops PULM: No respiratory distress.  No crackles, wheezes, or rales. GI: soft, non-tender. No guarding, rebound, or tenderness. + bowel sounds. Rectal exam performed with patient in prone position. Patient has exquisite tenderness to palpation. No external hemorrhoids visualized. Internally patient has firm rounded swelling noted at the 10:00 position.  GU: deferred Neuro: cranial nerves grossly 2-12 intact, no abnormalities of strength or sensation, A and O x 3 MSK: Patient moves all 4 extremities symmetrically, no deformity, edema, or injury noted Skin: No rashes petechiae, purpura, or jaundice   ED  Course  Procedures (including critical care time)  Labs Reviewed - No data to display No results found.   1. Rectal pain       MDM  Patient was evaluated by myself. Based on presentation patient once again appeared to have thrombosed internal hemorrhoid versus perianal abscess. He did not have any systemic signs of infection. Patient was tachycardic which I felt was secondary to pain. He  did not complain of increased bleeding with his pain and denied any symptoms that might suggest a symptomatic anemia. Given the location of the patient's lesion general surgery was consulted.  1:02 PM Surgery has completed evaluation the patient. They will admit the patient for evaluation operating room. Patient remained hemodynamically stable.        Cyndra Numbers, MD 03/30/12 1302

## 2012-03-30 NOTE — ED Notes (Signed)
Marlyne Beards PA at bedside

## 2012-03-30 NOTE — ED Notes (Signed)
Rectal pain started last night. No abdominal pain no complaints of black tarry stool. Has been seen here before for same complaint two days ago.

## 2012-03-30 NOTE — Progress Notes (Signed)
Patient received from OR.  Packing to rectum intact.  Patient voided freely in bathroom.  IV fluids infusing.  Patient on room air. SCD's on bilateral lower extremities.  Patient and family instructed on use of incentive spirometry.  Patient medicated for pain upon arrival back from surgery.  Will continue to monitor.

## 2012-03-30 NOTE — ED Notes (Signed)
Pt returned from CT °

## 2012-03-30 NOTE — ED Notes (Signed)
Patient transported to CT 

## 2012-03-30 NOTE — Anesthesia Preprocedure Evaluation (Signed)
Anesthesia Evaluation  Patient identified by MRN, date of birth, ID band Patient awake    Reviewed: Allergy & Precautions, H&P , NPO status , Patient's Chart, lab work & pertinent test results, reviewed documented beta blocker date and time   Airway Mallampati: II TM Distance: >3 FB Neck ROM: full    Dental No notable dental hx.    Pulmonary Current Smoker,  breath sounds clear to auscultation  Pulmonary exam normal       Cardiovascular Exercise Tolerance: Good negative cardio ROS  Rhythm:regular Rate:Normal     Neuro/Psych negative neurological ROS  negative psych ROS   GI/Hepatic negative GI ROS, Neg liver ROS,   Endo/Other  negative endocrine ROS  Renal/GU negative Renal ROS  negative genitourinary   Musculoskeletal   Abdominal   Peds  Hematology negative hematology ROS (+)   Anesthesia Other Findings   Reproductive/Obstetrics negative OB ROS                           Anesthesia Physical Anesthesia Plan  ASA: II and Emergent  Anesthesia Plan: General and General ETT   Post-op Pain Management:    Induction:   Airway Management Planned:   Additional Equipment:   Intra-op Plan:   Post-operative Plan:   Informed Consent: I have reviewed the patients History and Physical, chart, labs and discussed the procedure including the risks, benefits and alternatives for the proposed anesthesia with the patient or authorized representative who has indicated his/her understanding and acceptance.   Dental Advisory Given  Plan Discussed with: CRNA  Anesthesia Plan Comments:         Anesthesia Quick Evaluation

## 2012-03-30 NOTE — Brief Op Note (Signed)
03/30/2012  6:03 PM  PATIENT:  Andrew Gates  36 y.o. male  PRE-OPERATIVE DIAGNOSIS:  perirectal abscess  POST-OPERATIVE DIAGNOSIS:  perirectal abscess  PROCEDURE:  Procedure(s) (LRB): IRRIGATION AND DEBRIDEMENT PERIRECTAL ABSCESS (N/A)  SURGEON:  Surgeon(s) and Role:    * Lodema Pilot, DO - Primary  PHYSICIAN ASSISTANT:   ASSISTANTS: none   ANESTHESIA:   general  EBL:  Total I/O In: 1500 [I.V.:1500] Out: -   BLOOD ADMINISTERED:none  DRAINS: wound packing   LOCAL MEDICATIONS USED:  MARCAINE     SPECIMEN:  Source of Specimen:  cultures  DISPOSITION OF SPECIMEN:  micro  COUNTS:  YES  TOURNIQUET:  * No tourniquets in log *  DICTATION: .Other Dictation: Dictation Number   PLAN OF CARE: Admit to inpatient   PATIENT DISPOSITION:  PACU - hemodynamically stable.   Delay start of Pharmacological VTE agent (>24hrs) due to surgical blood loss or risk of bleeding: no

## 2012-03-30 NOTE — H&P (Signed)
Andrew Gates is an 36 y.o. male.   Chief Complaint: Rectal Pain HPI: 35 y/o male, who was here in Dec.2012 and had an I/D of perirectal abscess.  He's had a recurrence of his pain Monday. He was seen in the ER 2 days ago and treated with analgesics and referred to the GI service for evaluation. Nothing was visible on exam at that time. Since then the pain has become progressively worse, he can't move at all now without pain. It even hurts to cough. He been to 1 mg of IV Dilaudid when I arrived, and was in significant pain and discomfort just trying to turn over. Exam as noted below did not reveal any obvious source for his discomfort. He was evaluated by Dr. Biagio Quint, he could not find a source of his discomfort on exam on either. At this point we plan to admit patient for observation, obtain a CT of the pelvis to look for deeper infection. Follow full exam under anesthesia for further treatment after the CT scan. We've ordered some preliminary labs in anticipation of probable anesthesia. His last by mouth intake was 5 AM today.  Past Medical History  Diagnosis Date  . Hemorrhoids, he's had problems like this before.      Past Surgical History  Procedure Date  . Incision and drainage perirectal abscess 09/22/11    Done at the bedside in the ER .    History reviewed. No pertinent family history. Social History:  reports that he has been smoking Cigarettes.  He has been smoking about .5 packs per day. He has never used smokeless tobacco. He reports that he drinks alcohol. He reports that he does not use illicit drugs. tobacco use 9 years.  Allergies: No Known Allergies Prior to Admission medications   Medication Sig Start Date End Date Taking? Authorizing Provider  HYDROcodone-acetaminophen (NORCO) 5-325 MG per tablet Take 1-2 tablets by mouth every 6 (six) hours as needed. Pain 03/28/12 04/07/12 Yes Brigitte Cyndie Chime, PA-C  ibuprofen (ADVIL,MOTRIN) 200 MG tablet Take 400 mg by mouth every 6 (six)  hours as needed. For pain   Yes Historical Provider, MD  polyethylene glycol powder (GLYCOLAX/MIRALAX) powder Take 17 g by mouth daily. 03/28/12 03/31/12 Yes Brigitte Cyndie Chime, PA-C     (Not in a hospital admission)  No results found for this or any previous visit (from the past 48 hour(s)). No results found.  Review of Systems  Constitutional: Negative.   HENT: Negative.   Eyes: Negative.   Respiratory: Negative.   Cardiovascular: Negative.   Genitourinary: Negative.   Musculoskeletal: Negative.   Skin: Negative.   Neurological: Negative.   Endo/Heme/Allergies: Negative.   Psychiatric/Behavioral: Negative.     Blood pressure 143/89, pulse 114, temperature 97.9 F (36.6 C), temperature source Oral, resp. rate 22, height 5\' 9"  (1.753 m), weight 95.255 kg (210 lb), SpO2 100.00%. Physical Exam  Constitutional: He is oriented to person, place, and time. He appears well-developed and well-nourished. He appears distressed.       Well-developed well-nourished African American male who is lying on the bed Friday in pain with any movement.  HENT:  Head: Normocephalic and atraumatic.  Eyes: Conjunctivae and EOM are normal. Pupils are equal, round, and reactive to light. Right eye exhibits no discharge. Left eye exhibits no discharge. No scleral icterus.  Neck: Normal range of motion. Neck supple. No JVD present. No tracheal deviation present. No thyromegaly present.  Cardiovascular: Normal rate, regular rhythm, normal heart sounds and intact distal pulses.  Exam reveals no gallop and no friction rub.   No murmur heard. Respiratory: Effort normal and breath sounds normal. No respiratory distress. He has no wheezes. He has no rales. He exhibits no tenderness.  GI: Soft. Bowel sounds are normal. He exhibits no distension and no mass. There is no tenderness. There is no rebound and no guarding.  Genitourinary:       Rectal exam: There's some lubrication from the prior exam. Patient's extremely  tender. There is no peri-rectal abscess is palpated. Rectal exam is extremely tender, but no focal points can be found.  Musculoskeletal: Normal range of motion. He exhibits edema. He exhibits no tenderness.  Lymphadenopathy:    He has no cervical adenopathy.  Neurological: He is alert and oriented to person, place, and time. He has normal reflexes. No cranial nerve deficit.  Skin: Skin is warm and dry. No rash noted. He is not diaphoretic. No erythema.  Psychiatric: He has a normal mood and affect. His behavior is normal. Judgment and thought content normal.     Assessment/Plan 1. Probable perirectal/rectal abscess.  Plan:  CT of the pelvis; probable exam under anesthesia with further treatment as indicated.  Tiesha Marich 03/30/2012, 12:31 PM

## 2012-03-30 NOTE — Anesthesia Postprocedure Evaluation (Signed)
  Anesthesia Post-op Note  Patient: Andrew Gates  Procedure(s) Performed: Procedure(s) (LRB): IRRIGATION AND DEBRIDEMENT PERIRECTAL ABSCESS (N/A)  Patient Location: PACU  Anesthesia Type: General  Level of Consciousness: awake and alert   Airway and Oxygen Therapy: Patient Spontanous Breathing  Post-op Pain: mild  Post-op Assessment: Post-op Vital signs reviewed, Patient's Cardiovascular Status Stable, Respiratory Function Stable, Patent Airway and No signs of Nausea or vomiting  Post-op Vital Signs: stable  Complications: No apparent anesthesia complications

## 2012-03-30 NOTE — ED Notes (Signed)
Tried to call report to 5E, nurse unable to take report to call me back

## 2012-03-31 ENCOUNTER — Encounter (HOSPITAL_COMMUNITY): Payer: Self-pay | Admitting: General Surgery

## 2012-03-31 LAB — CBC
MCH: 26 pg (ref 26.0–34.0)
MCHC: 33.2 g/dL (ref 30.0–36.0)
Platelets: 177 10*3/uL (ref 150–400)
RDW: 14.6 % (ref 11.5–15.5)

## 2012-03-31 MED ORDER — NICOTINE 14 MG/24HR TD PT24
14.0000 mg | MEDICATED_PATCH | Freq: Every day | TRANSDERMAL | Status: DC
  Administered 2012-03-31 – 2012-04-01 (×2): 14 mg via TRANSDERMAL
  Filled 2012-03-31 (×2): qty 1

## 2012-03-31 MED ORDER — POLYETHYLENE GLYCOL 3350 17 G PO PACK
17.0000 g | PACK | Freq: Every day | ORAL | Status: DC
  Administered 2012-03-31 – 2012-04-01 (×2): 17 g via ORAL
  Filled 2012-03-31 (×2): qty 1

## 2012-03-31 NOTE — Progress Notes (Signed)
1 Day Post-Op  Subjective: Eating breakfast, says it still hurts to cough.  Much better than yesterday afternoon.  Objective: Vital signs in last 24 hours: Temp:  [97.6 F (36.4 C)-99.4 F (37.4 C)] 98 F (36.7 C) (06/21 0618) Pulse Rate:  [68-114] 75  (06/21 0618) Resp:  [16-22] 18  (06/21 0618) BP: (118-153)/(68-139) 136/78 mmHg (06/21 0618) SpO2:  [93 %-100 %] 93 % (06/21 0618) Weight:  [95.255 kg (210 lb)] 95.255 kg (210 lb) (06/20 0937)    afebrile, VSS, WBC is still up.  Intake/Output from previous day: 06/20 0701 - 06/21 0700 In: 2062 [I.V.:2062] Out: 2025 [Urine:2025] Intake/Output this shift:    General appearance: alert, cooperative and no distress Resp: wheezes bilaterally and with a nonproductive cough Incision/Wound:  Lab Results:   Basename 03/31/12 0503 03/30/12 1256  WBC 12.0* 11.6*  HGB 12.8* 14.6  HCT 38.5* 43.5  PLT 177 173    BMET  Basename 03/30/12 1256  NA 138  K 3.5  CL 99  CO2 26  GLUCOSE 88  BUN 6  CREATININE 0.96  CALCIUM 9.7   PT/INR No results found for this basename: LABPROT:2,INR:2 in the last 72 hours   Lab 03/30/12 1256  AST 20  ALT 18  ALKPHOS 96  BILITOT 0.6  PROT 8.4*  ALBUMIN 4.4     Lipase  No results found for this basename: lipase     Studies/Results: Ct Pelvis W Contrast  03/30/2012  *RADIOLOGY REPORT*  Clinical Data:  Rectal pain.  CT PELVIS WITH CONTRAST  Technique:  Multidetector CT imaging of the pelvis was performed using the standard protocol following the bolus administration of intravenous contrast.  Contrast: OMNIPAQUE IOHEXOL 300 MG/ML  SOLN  Comparison:   None.  Findings:  Enhancing fluid collection dorsal to the inferior rectum, laterally displacing the musculature of the rectal sling. On axial imaging, this measures 26 mm AP by 31 mm transverse.  This is compatible with a perirectal abscess.  Fat stranding is present in the ischiorectal fossa.  The urinary bladder appears within normal  limits.  Visualized small bowel, appendix and colon otherwise is within normal limits.  No inguinal adenopathy.  No other pelvic abscesses are identified.  Mild iliofemoral atherosclerosis.  The coronal reconstructions, the craniocaudal extent of the abscess is 3 cm.  There are no aggressive osseous lesions.  IMPRESSION:  1.  Crescentic dorsal perirectal abscess measuring 26 mm AP by 31 mm transverse. 2.  Ischiorectal fossa fat stranding, likely reactive to eye abscess.  No deep pelvic abscess is identified.  Original Report Authenticated By: Andreas Newport, M.D.    Medications:    . ciprofloxacin  400 mg Intravenous Q12H  . HYDROmorphone      .  HYDROmorphone (DILAUDID) injection  1 mg Intravenous Once  . metronidazole  500 mg Intravenous Q8H  . mupirocin ointment      . DISCONTD: ciprofloxacin  400 mg Intravenous Q12H  . DISCONTD: metronidazole  500 mg Intravenous Q8H    Assessment/Plan S/p I&D of left sided perirectal abscess. Cough and some wheezing/tobacco use.  Plan:  Dr. Biagio Quint wanted to leave packing today, and take down tomorrow.  I will have him get a stiz bath in AM, so it will either be out or easy to remove in AM.  Continue flagyl and cipro.       LOS: 1 day    JENNINGS,WILLARD 03/31/2012  He feels better. Wound looks okay.  I attempted to remove the packing  but he only tolerated half of this being removed.  He is not going to tolerate repacking so this is probably best.  Remove the remainder tomorrow and he may be okay for discharge after that if pain controlled.

## 2012-03-31 NOTE — Progress Notes (Signed)
CARE MANAGEMENT NOTE 03/31/2012  Patient:  Andrew Gates, Andrew Gates   Account Number:  [id number]  Date Initiated:  03/31/2012  Documentation initiated by:  Damont Balles  Subjective/Objective Assessment:   pt with perirectal abcess and requiring pain control and I and D in O.R.     Action/Plan:   lives at home   Anticipated DC Date:  04/03/2012   Anticipated DC Plan:  HOME/SELF CARE  In-house referral  NA      DC Planning Services  NA      [facility] Choice  NA   Choice offered to / List presented to:  NA   DME arranged  NA      DME agency  NA     HH arranged  NA      HH agency  NA   Status of service:  In process, will continue to follow Medicare Important Message given?  NO (If response is "NO", the following Medicare IM given date fields will be blank) Date Medicare IM given:   Date Additional Medicare IM given:    Discharge Disposition:    Per UR Regulation:  Reviewed for med. necessity/level of care/duration of stay  If discussed at Long Length of Stay Meetings, dates discussed:    Comments:  [id number]/Reichen Hutzler,Rn,BSn,CCM

## 2012-03-31 NOTE — Op Note (Signed)
NAMEJACEYON, STROLE NO.:  [id number]  MEDICAL RECORD NO.:  [id number]  LOCATION:  1521                         FACILITY:  [facility]  PHYSICIAN:  Lodema Pilot, MD       DATE OF BIRTH:  06-Sep-1976  DATE OF PROCEDURE:  03/30/2012 DATE OF DISCHARGE:                              OPERATIVE REPORT   PROCEDURE:  Rectal exam under anesthesia with drainage of perirectal abscess.  PREOPERATIVE DIAGNOSIS:  Perirectal abscess.  POSTOPERATIVE DIAGNOSIS:  Perirectal abscess.  SURGEON:  Lodema Pilot, MD  ASSISTANT:  None.  ANESTHESIA:  General endotracheal tube anesthesia.  FLUIDS:  Please see anesthesia record for fluid administration.  ESTIMATED BLOOD LOSS:  Minimal.  DRAINS:  Wound packing.  SPECIMENS:  Wound cultures.  COMPLICATIONS:  None apparent.  FINDINGS:  Extrasphincteric abscess with a significant amount of purulent material, posterior anal fissure.  Wound packed with 0.5-inch Nu Gauze.  INDICATION FOR PROCEDURE:  Mr. Wymer is a 36 year old male who recently had incision and drainage of a perirectal abscess, he did well with that, but returned today few months later with similar symptoms of exquisite rectal pain.  He was seen 2 days ago in the emergency room and sent home, and pain has progressed and continues.  He also had a CT scan, which demonstrated a 3-cm abscess posterior to the rectum.  OPERATIVE DETAILS:  Mr. Dues was seen and evaluated in the emergency room and on the hospital ward.  Risks and benefits of the procedure were discussed in lay terms and informed consent was obtained.  I specifically discussed with him the risks of persistent infection or recurrence and un-drained fluid collection, injury to the sphincter causing incontinence, fistula formation, and bleeding, and he expressed understanding and desired to proceed with formal exam under anesthesia with possible drainage or aspiration of his abscess.  He was taken to the  operating room, placed on the table in the supine position.  General endotracheal tube anesthesia was obtained and therapeutic antibiotics were given.  He was then placed in lithotomy position because this was noted to be posterior on imaging.  Then, the procedure time-out was performed with all operative team members and the perirectal area was prepped and draped in the standard surgical fashion.  After I had on the suite, did examine the area, he did appear to have some palpable fluctuance at the 5 o'clock position near the anus and I placed an 18- gauge needle into this area and immediately was able to aspirate purulent material.  Cultures were taken and sent to microbiology.  I placed an anoscope into the rectum, examined the area and he had no obvious internal induration or fluctuance or any evidence of purulent fluid.  He had a posterior anal fissure, which was actually fairly significant fissure without any accident.  This appeared chronic without any evidence of bleeding and one could actually see the circular fibers of his anal sphincter muscle.  I tried to aspirate posteriorly just above his sphincter muscles through the mucosa and I did not get any return of purulence.  Therefore, I decided to open up externally the area where I was able to aspirate the purulence.  I made  a slit in the skin and then probed this area with the hemostats and after I was able to identify which way this was coursing, I made a circular incision in the skin and evacuated the purulent material.  I probed this area and this appeared to course around the sphincter muscles in extrasphincteric Fashion. I could palpate a hemostat through the rectum, but there was no area of internal drainage identified and no internal opening was made trying to minimize chance of fistula formation, and because of the sphincter involvement, I did not open this entire area.  I irrigated the cavity and Bovie was used for  hemostasis and after no additional purulence was able to be expressed, I soaked a plain 0.5-inch packing gauze in 0.25% Marcaine with epinephrine and packed this into the cavity for added hemostasis and for pain control and to allow for ongoing drainage.  The wound was noted to be hemostatic and the area was dressed with sterile gauze dressing and the patient tolerated the procedure well without apparent complications.  He was stable and ready for transfer to the recovery room in stable condition.          ______________________________ Lodema Pilot, MD     BL/MEDQ  D:  03/30/2012  T:  03/30/2012  Job:  [id number]

## 2012-03-31 NOTE — Progress Notes (Signed)
Patient medicated with morphine after doctor removed some of packing.  Please pre medicate when doctor arrives tomorrow before packing removed

## 2012-04-01 MED ORDER — CIPROFLOXACIN HCL 750 MG PO TABS: 750.0000 mg | ORAL_TABLET | Freq: Two times a day (BID) | ORAL | Status: AC

## 2012-04-01 MED ORDER — METRONIDAZOLE 500 MG PO TABS: 500.0000 mg | ORAL_TABLET | Freq: Three times a day (TID) | ORAL | Status: AC

## 2012-04-01 MED ORDER — OXYCODONE-ACETAMINOPHEN 5-325 MG PO TABS: 1.0000 | ORAL_TABLET | ORAL | Status: AC | PRN

## 2012-04-01 NOTE — Progress Notes (Signed)
2 Days Post-Op  Subjective: More comfortable  Objective: Vital signs in last 24 hours: Temp:  [98.3 F (36.8 C)-99 F (37.2 C)] 98.7 F (37.1 C) (06/22 0600) Pulse Rate:  [68-80] 75  (06/22 0600) Resp:  [18-20] 20  (06/22 0600) BP: (118-138)/(77-86) 136/86 mmHg (06/22 0600) SpO2:  [97 %-99 %] 99 % (06/22 0600) Last BM Date: 03/31/12  Intake/Output from previous day: 06/21 0701 - 06/22 0700 In: 825.8 [P.O.:240; I.V.:385.8; IV Piggyback:200] Out: -  Intake/Output this shift:    PE: Perianal- packing removed, some purulent drainage from wound  Lab Results:   Basename 03/31/12 0503 03/30/12 1256  WBC 12.0* 11.6*  HGB 12.8* 14.6  HCT 38.5* 43.5  PLT 177 173   BMET  Basename 03/30/12 1256  NA 138  K 3.5  CL 99  CO2 26  GLUCOSE 88  BUN 6  CREATININE 0.96  CALCIUM 9.7   PT/INR No results found for this basename: LABPROT:2,INR:2 in the last 72 hours Comprehensive Metabolic Panel:    Component Value Date/Time   NA 138 03/30/2012 1256   K 3.5 03/30/2012 1256   CL 99 03/30/2012 1256   CO2 26 03/30/2012 1256   BUN 6 03/30/2012 1256   CREATININE 0.96 03/30/2012 1256   GLUCOSE 88 03/30/2012 1256   CALCIUM 9.7 03/30/2012 1256   AST 20 03/30/2012 1256   ALT 18 03/30/2012 1256   ALKPHOS 96 03/30/2012 1256   BILITOT 0.6 03/30/2012 1256   PROT 8.4* 03/30/2012 1256   ALBUMIN 4.4 03/30/2012 1256     Studies/Results: Ct Pelvis W Contrast  03/30/2012  *RADIOLOGY REPORT*  Clinical Data:  Rectal pain.  CT PELVIS WITH CONTRAST  Technique:  Multidetector CT imaging of the pelvis was performed using the standard protocol following the bolus administration of intravenous contrast.  Contrast: OMNIPAQUE IOHEXOL 300 MG/ML  SOLN  Comparison:   None.  Findings:  Enhancing fluid collection dorsal to the inferior rectum, laterally displacing the musculature of the rectal sling. On axial imaging, this measures 26 mm AP by 31 mm transverse.  This is compatible with a perirectal abscess.  Fat  stranding is present in the ischiorectal fossa.  The urinary bladder appears within normal limits.  Visualized small bowel, appendix and colon otherwise is within normal limits.  No inguinal adenopathy.  No other pelvic abscesses are identified.  Mild iliofemoral atherosclerosis.  The coronal reconstructions, the craniocaudal extent of the abscess is 3 cm.  There are no aggressive osseous lesions.  IMPRESSION:  1.  Crescentic dorsal perirectal abscess measuring 26 mm AP by 31 mm transverse. 2.  Ischiorectal fossa fat stranding, likely reactive to eye abscess.  No deep pelvic abscess is identified.  Original Report Authenticated By: Andreas Newport, M.D.    Anti-infectives: Anti-infectives     Start     Dose/Rate Route Frequency Ordered Stop   03/31/12 0500   ciprofloxacin (CIPRO) IVPB 400 mg        400 mg 200 mL/hr over 60 Minutes Intravenous Every 12 hours 03/30/12 1827     03/31/12 0100   metroNIDAZOLE (FLAGYL) IVPB 500 mg        500 mg 100 mL/hr over 60 Minutes Intravenous Every 8 hours 03/30/12 1827     03/30/12 1800   ciprofloxacin (CIPRO) IVPB 400 mg  Status:  Discontinued        400 mg 200 mL/hr over 60 Minutes Intravenous Every 12 hours 03/30/12 1555 03/30/12 1835   03/30/12 1700   metroNIDAZOLE (  FLAGYL) IVPB 500 mg  Status:  Discontinued        500 mg 100 mL/hr over 60 Minutes Intravenous Every 8 hours 03/30/12 1555 03/30/12 1835          Assessment Active Problems: Anorectal abscess s/p I and D 03/30/12-natural history of this process and possible outcomes were discussed with him.  Wound care was discussed at length with him.    LOS: 2 days   Plan: Discharge on abxs.  Wash wound three times a day.  Follow up in office next week.   Jozelynn Danielson Shela Commons 04/01/2012

## 2012-04-01 NOTE — Discharge Instructions (Signed)
Peri-Rectal Abscess Your caregiver has diagnosed you as having a peri-rectal abscess. This is an infected area near the rectum that is filled with pus. If the abscess is near the surface of the skin, your caregiver may open (incise) the area and drain the pus. HOME CARE INSTRUCTIONS   Clean the area three times a day as we discussed and put a dry pad in your underwear.  Return for a wound check next week with Dr. Biagio Quint.  Call [phone] for appointment.  An "inflatable doughnut" may be used for sitting with added comfort. These can be purchased at a drugstore or medical supply house.   To reduce pain and straining with bowel movements, eat a high fiber diet with plenty of fruits and vegetables. Use stool softeners twice a day-over the counter. This is especially important if narcotic type pain medications were prescribed as these may cause marked constipation.   Only take over-the-counter or prescription medicines for pain, discomfort, or fever as directed by your caregiver.  SEEK IMMEDIATE MEDICAL CARE IF:   You have increasing pain that is not controlled by medication.   There is increased inflammation (redness), swelling, bleeding, or drainage from the area.   An oral temperature above 102 F (38.9 C) develops.   You develop chills or generalized malaise (feel lethargic or feel "washed out").   You develop any new symptoms (problems) you feel may be related to your present problem.  Document Released: 09/24/2000 Document Revised: 09/16/2011 Document Reviewed: 09/24/2008 [facility] Patient Information 2012 [place], [place].

## 2012-04-03 LAB — CULTURE, ROUTINE-ABSCESS

## 2012-04-04 NOTE — Discharge Summary (Signed)
Physician Discharge Summary  Patient ID: Andrew Gates MRN: [id number] DOB/AGE: Sep 10, 1976 36 y.o.  Admit date: 03/30/2012 Discharge date: 04/04/2012  Admission Diagnoses: Perirectal abscess  Discharge Diagnoses: Same Active Problems:  * No active hospital problems. *    PROCEDURES: i/d Perirectal abacess 03/30/12 Dr. Wynelle Beckmann Course: 36 y/o male, who was here in Dec.2012 and had an I/D of perirectal abscess. He's had a recurrence of his pain Monday. He was seen in the ER 2 days ago and treated with analgesics and referred to the GI service for evaluation. Nothing was visible on exam at that time. Since then the pain has become progressively worse, he can't move at all now without pain. It even hurts to cough. He been to 1 mg of IV Dilaudid when I arrived, and was in significant pain and discomfort just trying to turn over. Exam as noted below did not reveal any obvious source for his discomfort. He was evaluated by Dr. Biagio Quint, he could not find a source of his discomfort on exam on either. At this point we plan to admit patient for observation, obtain a CT of the pelvis to look for deeper infection. Follow full exam under anesthesia for further treatment after the CT scan. We've ordered some preliminary labs in anticipation of probable anesthesia. His last by mouth intake was 5 AM today. He underwent I/D in the OR, he did well.  Post op he made good progress and was discharged home in the next 48 hours on pain meds and antibiotics.  Follow up in 2 weeks DR. Layton    Disposition: 01-Home or Self Care   Medication List  As of 04/04/2012  1:30 PM   STOP taking these medications         HYDROcodone-acetaminophen 5-325 MG per tablet      ibuprofen 200 MG tablet      polyethylene glycol powder powder         TAKE these medications         ciprofloxacin 750 MG tablet   Commonly known as: CIPRO   Take 1 tablet (750 mg total) by mouth 2 (two) times daily.      metroNIDAZOLE  500 MG tablet   Commonly known as: FLAGYL   Take 1 tablet (500 mg total) by mouth 3 (three) times daily.      oxyCODONE-acetaminophen 5-325 MG per tablet   Commonly known as: PERCOCET   Take 1-2 tablets by mouth every 4 (four) hours as needed for pain.           Follow-up Information    Follow up with Gates, Andrew DAVID, DO. Schedule an appointment as soon as possible for a visit in 2 weeks.   Contact information:   [address] [place] [address] [phone]       Please follow up. (Wet to dry dressing to area twice a day, more if needed.  You can shower then sit in tub for sitz bath. Keep a 2 x2 packed in this area if you can. )          Signed: Dhilan Brauer 04/04/2012, 1:30 PM

## 2012-04-05 LAB — ANAEROBIC CULTURE

## 2012-05-31 ENCOUNTER — Encounter (INDEPENDENT_AMBULATORY_CARE_PROVIDER_SITE_OTHER): Payer: Self-pay | Admitting: General Surgery

## 2015-04-08 ENCOUNTER — Emergency Department (HOSPITAL_COMMUNITY): Payer: 59

## 2015-04-08 ENCOUNTER — Observation Stay (HOSPITAL_COMMUNITY): Payer: 59 | Admitting: Anesthesiology

## 2015-04-08 ENCOUNTER — Encounter (HOSPITAL_COMMUNITY)
Admission: EM | Disposition: A | Discharge status: Home / Self Care | Payer: Self-pay | Source: Home / Self Care | Attending: Emergency Medicine

## 2015-04-08 ENCOUNTER — Observation Stay (HOSPITAL_COMMUNITY)
Admission: EM | Admit: 2015-04-08 | Discharge: 2015-04-09 | Disposition: A | Discharge status: Home / Self Care | Payer: 59 | Attending: General Surgery | Admitting: General Surgery

## 2015-04-08 ENCOUNTER — Encounter (HOSPITAL_COMMUNITY): Payer: Self-pay

## 2015-04-08 DIAGNOSIS — F17210 Nicotine dependence, cigarettes, uncomplicated: Secondary | ICD-10-CM | POA: Insufficient documentation

## 2015-04-08 DIAGNOSIS — K611 Rectal abscess: Secondary | ICD-10-CM | POA: Diagnosis not present

## 2015-04-08 DIAGNOSIS — K6289 Other specified diseases of anus and rectum: Secondary | ICD-10-CM | POA: Diagnosis present

## 2015-04-08 DIAGNOSIS — Z79899 Other long term (current) drug therapy: Secondary | ICD-10-CM | POA: Diagnosis not present

## 2015-04-08 DIAGNOSIS — Z79891 Long term (current) use of opiate analgesic: Secondary | ICD-10-CM | POA: Insufficient documentation

## 2015-04-08 DIAGNOSIS — K648 Other hemorrhoids: Secondary | ICD-10-CM | POA: Insufficient documentation

## 2015-04-08 HISTORY — PX: INCISION AND DRAINAGE PERIRECTAL ABSCESS: SHX1804

## 2015-04-08 LAB — CBC WITH DIFFERENTIAL/PLATELET
BASOS ABS: 0 10*3/uL (ref 0.0–0.1)
BASOS PCT: 0 % (ref 0–1)
Eosinophils Absolute: 0.1 10*3/uL (ref 0.0–0.7)
Eosinophils Relative: 1 % (ref 0–5)
HEMATOCRIT: 40.3 % (ref 39.0–52.0)
HEMOGLOBIN: 13.7 g/dL (ref 13.0–17.0)
LYMPHS PCT: 17 % (ref 12–46)
Lymphs Abs: 1.7 10*3/uL (ref 0.7–4.0)
MCH: 26.6 pg (ref 26.0–34.0)
MCHC: 34 g/dL (ref 30.0–36.0)
MCV: 78.1 fL (ref 78.0–100.0)
MONOS PCT: 9 % (ref 3–12)
Monocytes Absolute: 0.9 10*3/uL (ref 0.1–1.0)
NEUTROS ABS: 6.8 10*3/uL (ref 1.7–7.7)
NEUTROS PCT: 73 % (ref 43–77)
Platelets: 185 10*3/uL (ref 150–400)
RBC: 5.16 MIL/uL (ref 4.22–5.81)
RDW: 14.7 % (ref 11.5–15.5)
WBC: 9.5 10*3/uL (ref 4.0–10.5)

## 2015-04-08 LAB — BASIC METABOLIC PANEL
Anion gap: 10 (ref 5–15)
BUN: 10 mg/dL (ref 6–20)
CALCIUM: 8.7 mg/dL — AB (ref 8.9–10.3)
CO2: 23 mmol/L (ref 22–32)
Chloride: 104 mmol/L (ref 101–111)
Creatinine, Ser: 0.96 mg/dL (ref 0.61–1.24)
GLUCOSE: 118 mg/dL — AB (ref 65–99)
POTASSIUM: 3.7 mmol/L (ref 3.5–5.1)
SODIUM: 137 mmol/L (ref 135–145)

## 2015-04-08 SURGERY — EXAM UNDER ANESTHESIA WITH HEMORRHOIDECTOMY
Anesthesia: General | Site: Rectum

## 2015-04-08 SURGERY — INCISION AND DRAINAGE, ABSCESS, PERIRECTAL
Anesthesia: General

## 2015-04-08 MED ORDER — BUPIVACAINE-EPINEPHRINE (PF) 0.25% -1:200000 IJ SOLN
INTRAMUSCULAR | Status: AC
  Filled 2015-04-08: qty 30

## 2015-04-08 MED ORDER — OXYCODONE HCL 5 MG PO TABS
5.0000 mg | ORAL_TABLET | ORAL | Status: DC | PRN
  Administered 2015-04-09: 5 mg via ORAL
  Filled 2015-04-08: qty 1

## 2015-04-08 MED ORDER — ACETAMINOPHEN 650 MG RE SUPP: 650.0000 mg | Freq: Four times a day (QID) | RECTAL | Status: DC | PRN

## 2015-04-08 MED ORDER — ACETAMINOPHEN 500 MG PO TABS: 1000.0000 mg | ORAL_TABLET | Freq: Once | ORAL | Status: DC

## 2015-04-08 MED ORDER — HYDROMORPHONE HCL 1 MG/ML IJ SOLN: 1.0000 mg | INTRAMUSCULAR | Status: DC | PRN

## 2015-04-08 MED ORDER — ONDANSETRON HCL 4 MG/2ML IJ SOLN: 4.0000 mg | Freq: Four times a day (QID) | INTRAMUSCULAR | Status: DC | PRN

## 2015-04-08 MED ORDER — DEXTROSE 5 % IV SOLN
2.0000 g | INTRAVENOUS | Status: AC
  Administered 2015-04-08: 2 g via INTRAVENOUS

## 2015-04-08 MED ORDER — FENTANYL CITRATE (PF) 100 MCG/2ML IJ SOLN: 2.0000 ug | Freq: Once | INTRAMUSCULAR | Status: DC

## 2015-04-08 MED ORDER — LACTATED RINGERS IV SOLN
INTRAVENOUS | Status: DC | PRN
  Administered 2015-04-08: 14:00:00 via INTRAVENOUS

## 2015-04-08 MED ORDER — ACETAMINOPHEN 325 MG PO TABS: 650.0000 mg | ORAL_TABLET | Freq: Four times a day (QID) | ORAL | Status: DC | PRN

## 2015-04-08 MED ORDER — MIDAZOLAM HCL 2 MG/2ML IJ SOLN
INTRAMUSCULAR | Status: AC
  Filled 2015-04-08: qty 2

## 2015-04-08 MED ORDER — 0.9 % SODIUM CHLORIDE (POUR BTL) OPTIME
TOPICAL | Status: DC | PRN
  Administered 2015-04-08: 1000 mL

## 2015-04-08 MED ORDER — KCL IN DEXTROSE-NACL 20-5-0.45 MEQ/L-%-% IV SOLN
INTRAVENOUS | Status: DC
  Administered 2015-04-08: 50 mL/h via INTRAVENOUS
  Filled 2015-04-08: qty 1000

## 2015-04-08 MED ORDER — PROPOFOL 10 MG/ML IV BOLUS
INTRAVENOUS | Status: AC
  Filled 2015-04-08: qty 20

## 2015-04-08 MED ORDER — SODIUM CHLORIDE 0.9 % IV SOLN: INTRAVENOUS | Status: DC

## 2015-04-08 MED ORDER — SODIUM CHLORIDE 0.9 % IV SOLN: 3.0000 g | Freq: Four times a day (QID) | INTRAVENOUS | Status: DC

## 2015-04-08 MED ORDER — DIPHENHYDRAMINE HCL 50 MG/ML IJ SOLN: 12.5000 mg | Freq: Four times a day (QID) | INTRAMUSCULAR | Status: DC | PRN

## 2015-04-08 MED ORDER — HYDROMORPHONE HCL 1 MG/ML IJ SOLN
1.0000 mg | Freq: Once | INTRAMUSCULAR | Status: AC
  Administered 2015-04-08: 1 mg via INTRAVENOUS
  Filled 2015-04-08: qty 1

## 2015-04-08 MED ORDER — METRONIDAZOLE IN NACL 5-0.79 MG/ML-% IV SOLN: 500.0000 mg | Freq: Three times a day (TID) | INTRAVENOUS | Status: DC

## 2015-04-08 MED ORDER — MIDAZOLAM HCL 5 MG/5ML IJ SOLN
INTRAMUSCULAR | Status: DC | PRN
  Administered 2015-04-08: 2 mg via INTRAVENOUS

## 2015-04-08 MED ORDER — BUPIVACAINE-EPINEPHRINE 0.25% -1:200000 IJ SOLN
INTRAMUSCULAR | Status: DC | PRN
  Administered 2015-04-08: 30 mL

## 2015-04-08 MED ORDER — LIDOCAINE-EPINEPHRINE 2 %-1:100000 IJ SOLN
20.0000 mL | Freq: Once | INTRAMUSCULAR | Status: DC
  Filled 2015-04-08: qty 1

## 2015-04-08 MED ORDER — IOHEXOL 300 MG/ML  SOLN
25.0000 mL | Freq: Once | INTRAMUSCULAR | Status: AC | PRN
  Administered 2015-04-08: 25 mL via ORAL

## 2015-04-08 MED ORDER — ACETAMINOPHEN 650 MG RE SUPP
650.0000 mg | RECTAL | Status: DC | PRN
  Filled 2015-04-08: qty 1

## 2015-04-08 MED ORDER — FENTANYL CITRATE (PF) 100 MCG/2ML IJ SOLN
INTRAMUSCULAR | Status: AC
  Filled 2015-04-08: qty 2

## 2015-04-08 MED ORDER — SODIUM CHLORIDE 0.9 % IJ SOLN: 3.0000 mL | Freq: Two times a day (BID) | INTRAMUSCULAR | Status: DC

## 2015-04-08 MED ORDER — LACTATED RINGERS IV SOLN
INTRAVENOUS | Status: DC
  Administered 2015-04-08: 16:00:00 via INTRAVENOUS
  Administered 2015-04-08: 1000 mL via INTRAVENOUS

## 2015-04-08 MED ORDER — LACTATED RINGERS IV SOLN: INTRAVENOUS | Status: DC

## 2015-04-08 MED ORDER — FENTANYL CITRATE (PF) 100 MCG/2ML IJ SOLN
50.0000 ug | INTRAMUSCULAR | Status: AC | PRN
  Administered 2015-04-08 (×3): 50 ug via INTRAVENOUS

## 2015-04-08 MED ORDER — KCL IN DEXTROSE-NACL 20-5-0.45 MEQ/L-%-% IV SOLN
INTRAVENOUS | Status: DC
  Filled 2015-04-08: qty 1000

## 2015-04-08 MED ORDER — ACETAMINOPHEN 325 MG PO TABS: 650.0000 mg | ORAL_TABLET | ORAL | Status: DC | PRN

## 2015-04-08 MED ORDER — DIPHENHYDRAMINE HCL 12.5 MG/5ML PO ELIX: 12.5000 mg | ORAL_SOLUTION | Freq: Four times a day (QID) | ORAL | Status: DC | PRN

## 2015-04-08 MED ORDER — SODIUM CHLORIDE 0.9 % IV SOLN
3.0000 g | Freq: Four times a day (QID) | INTRAVENOUS | Status: DC
  Administered 2015-04-08 – 2015-04-09 (×3): 3 g via INTRAVENOUS
  Filled 2015-04-08 (×3): qty 3

## 2015-04-08 MED ORDER — DEXTROSE 5 % IV SOLN
INTRAVENOUS | Status: AC
  Filled 2015-04-08: qty 2

## 2015-04-08 MED ORDER — SODIUM CHLORIDE 0.9 % IJ SOLN: 3.0000 mL | INTRAMUSCULAR | Status: DC | PRN

## 2015-04-08 MED ORDER — HYDROMORPHONE HCL 1 MG/ML IJ SOLN
1.0000 mg | INTRAMUSCULAR | Status: DC | PRN
  Administered 2015-04-08 (×2): 1 mg via INTRAVENOUS
  Filled 2015-04-08 (×2): qty 1

## 2015-04-08 MED ORDER — FENTANYL CITRATE (PF) 100 MCG/2ML IJ SOLN: 25.0000 ug | INTRAMUSCULAR | Status: DC | PRN

## 2015-04-08 MED ORDER — PROPOFOL 10 MG/ML IV BOLUS
INTRAVENOUS | Status: DC | PRN
  Administered 2015-04-08: 200 mg via INTRAVENOUS

## 2015-04-08 MED ORDER — MORPHINE SULFATE 2 MG/ML IJ SOLN: 1.0000 mg | INTRAMUSCULAR | Status: DC | PRN

## 2015-04-08 MED ORDER — OXYCODONE-ACETAMINOPHEN 5-325 MG PO TABS: 1.0000 | ORAL_TABLET | ORAL | Status: DC | PRN

## 2015-04-08 MED ORDER — SODIUM CHLORIDE 0.9 % IV SOLN: 250.0000 mL | INTRAVENOUS | Status: DC | PRN

## 2015-04-08 MED ORDER — POTASSIUM CHLORIDE IN NACL 20-0.9 MEQ/L-% IV SOLN
INTRAVENOUS | Status: DC
  Filled 2015-04-08: qty 1000

## 2015-04-08 MED ORDER — HYDROCODONE-ACETAMINOPHEN 5-325 MG PO TABS
1.0000 | ORAL_TABLET | ORAL | Status: DC | PRN
  Administered 2015-04-09 (×2): 1 via ORAL
  Filled 2015-04-08 (×2): qty 1

## 2015-04-08 MED ORDER — ACETAMINOPHEN 325 MG PO TABS
ORAL_TABLET | ORAL | Status: AC
  Filled 2015-04-08: qty 3

## 2015-04-08 MED ORDER — DEXAMETHASONE SODIUM PHOSPHATE 10 MG/ML IJ SOLN
INTRAMUSCULAR | Status: AC
  Filled 2015-04-08: qty 1

## 2015-04-08 MED ORDER — IOHEXOL 300 MG/ML  SOLN
100.0000 mL | Freq: Once | INTRAMUSCULAR | Status: AC | PRN
  Administered 2015-04-08: 100 mL via INTRAVENOUS

## 2015-04-08 MED ORDER — METOCLOPRAMIDE HCL 5 MG/ML IJ SOLN
INTRAMUSCULAR | Status: AC
  Filled 2015-04-08: qty 2

## 2015-04-08 SURGICAL SUPPLY — 22 items
BLADE SURG 15 STRL LF DISP TIS (BLADE) ×1 IMPLANT
BLADE SURG 15 STRL SS (BLADE) ×2
DRAIN PENROSE 18X1/4 LTX STRL (WOUND CARE) IMPLANT
DRSG KUZMA FLUFF (GAUZE/BANDAGES/DRESSINGS) ×1 IMPLANT
DRSG PAD ABDOMINAL 8X10 ST (GAUZE/BANDAGES/DRESSINGS) ×2 IMPLANT
GAUZE PACKING IODOFORM 1/4X15 (GAUZE/BANDAGES/DRESSINGS) ×1 IMPLANT
GAUZE SPONGE 4X4 12PLY STRL (GAUZE/BANDAGES/DRESSINGS) ×1 IMPLANT
GLOVE BIOGEL PI IND STRL 7.0 (GLOVE) ×1 IMPLANT
GLOVE BIOGEL PI INDICATOR 7.0 (GLOVE) ×1
GOWN L4 XXLG W/PAP TWL (GOWN DISPOSABLE) ×2 IMPLANT
GOWN STRL REUS W/TWL XL LVL3 (GOWN DISPOSABLE) ×2 IMPLANT
LUBRICANT JELLY K Y 4OZ (MISCELLANEOUS) ×2 IMPLANT
PACK LITHOTOMY IV (CUSTOM PROCEDURE TRAY) ×2 IMPLANT
PAD ABD 8X10 STRL (GAUZE/BANDAGES/DRESSINGS) ×1 IMPLANT
PENCIL BUTTON HOLSTER BLD 10FT (ELECTRODE) ×2 IMPLANT
SPONGE LAP 18X18 X RAY DECT (DISPOSABLE) ×2 IMPLANT
SUT SILK 2 0 (SUTURE)
SUT SILK 2-0 18XBRD TIE 12 (SUTURE) IMPLANT
TAPE CLOTH SURG 6X10 WHT LF (GAUZE/BANDAGES/DRESSINGS) ×1 IMPLANT
TOWEL OR 17X26 10 PK STRL BLUE (TOWEL DISPOSABLE) ×2 IMPLANT
TOWEL OR NON WOVEN STRL DISP B (DISPOSABLE) ×2 IMPLANT
YANKAUER SUCT BULB TIP 10FT TU (MISCELLANEOUS) ×2 IMPLANT

## 2015-04-08 NOTE — Transfer of Care (Signed)
Immediate Anesthesia Transfer of Care Note  Patient: Andrew Gates  Procedure(s) Performed: Procedure(s): IRRIGATION AND DEBRIDEMENT PERIRECTAL ABSCESS (N/A)  Patient Location: PACU  Anesthesia Type:General  Level of Consciousness: awake, sedated and patient cooperative  Airway & Oxygen Therapy: Patient Spontanous Breathing and Patient connected to face mask oxygen  Post-op Assessment: Report given to RN and Post -op Vital signs reviewed and stable  Post vital signs: Reviewed and stable  Last Vitals:  Filed Vitals:   04/08/15 1249  BP: 124/70  Pulse: 83  Temp: 36.8 C  Resp: 18    Complications: No apparent anesthesia complications

## 2015-04-08 NOTE — ED Notes (Signed)
ALL belongings with family.

## 2015-04-08 NOTE — Discharge Instructions (Signed)
ANORECTAL SURGERY: POST OP INSTRUCTIONS °1. Take your usually prescribed home medications unless otherwise directed. °2. DIET: During the first few hours after surgery sip on some liquids until you are able to urinate.  It is normal to not urinate for several hours after this surgery.  If you feel uncomfortable, please contact the office for instructions.  After you are able to urinate,you may eat, if you feel like it.  Follow a light bland diet the first 24 hours after arrival home, such as soup, liquids, crackers, etc.  Be sure to include lots of fluids daily (6-8 glasses).  Avoid fast food or heavy meals, as your are more likely to get nauseated.  Eat a low fat diet the next few days after surgery.  Limit caffeine intake to 1-2 servings a day. °3. PAIN CONTROL: °a. Pain is best controlled by a usual combination of several different methods TOGETHER: °i. Muscle relaxation °1.  Soak in a warm bath (or Sitz bath) three times a day and after bowel movements.  Continue to do this until all pain is resolved. °2. Take the muscle relaxer (Valium) every 6 hours for the first 2 days after surgery  °ii. Over the counter pain medication °iii. Prescription pain medication °b. Most patients will experience some swelling and discomfort in the anus/rectal area and incisions.  Heat such as warm towels, sitz baths, warm baths, etc to help relax tight/sore spots and speed recovery.  Some people prefer to use ice, especially in the first couple days after surgery, as it may decrease the pain and swelling, or alternate between ice & heat.  Experiment to what works for you.  Swelling and bruising can take several weeks to resolve.  Pain can take even longer to completely resolve. °c. It is helpful to take an over-the-counter pain medication regularly for the first few weeks.  Choose one of the following that works best for you: °i. Naproxen (Aleve, etc)  Two 220mg tabs twice a day °ii. Ibuprofen (Advil, etc) Three 200mg tabs four  times a day (every meal & bedtime) °d. A  prescription for pain medication (such as percocet, oxycodone, hydrocodone, etc) should be given to you upon discharge.  Take your pain medication as prescribed.  °i. If you are having problems/concerns with the prescription medicine (does not control pain, nausea, vomiting, rash, itching, etc), please call us [phone] to see if we need to switch you to a different pain medicine that will work better for you and/or control your side effect better. °ii. If you need a refill on your pain medication, please contact your pharmacy.  They will contact our office to request authorization. Prescriptions will not be filled after 5 pm or on week-ends. °4. KEEP YOUR BOWELS REGULAR and AVOID CONSTIPATION °a. The goal is one to two soft bowel movements a day.  You should at least have a bowel movement every other day. °b. Avoid getting constipated.  Between the surgery and the pain medications, it is common to experience some constipation. This can be very painful after rectal surgery.  Increasing fluid intake and taking a fiber supplement (such as Metamucil, Citrucel, FiberCon, etc) 1-2 times a day regularly will usually help prevent this problem from occurring.  A stool softener like colace is also recommended.  This can be purchased over the counter at your pharmacy.  You can take it up to 3 times a day.  If you do not have a bowel movement after 24 hrs since your surgery,   take one does of milk of magnesia.  If you still haven't had a bowel movement 8-12 hours after that dose, take another dose.  If you don't have a bowel movement 48 hrs after surgery, purchase a Fleets enema from the drug store and administer gently per package instructions.  If you still are having trouble with your bowel movements after that, please call the office for further instructions. °c. If you develop diarrhea or have many loose bowel movements, simplify your diet to bland foods & liquids for a few  days.  Stop any stool softeners and decrease your fiber supplement.  Switching to mild anti-diarrheal medications (Kayopectate, Pepto Bismol) can help.  If this worsens or does not improve, please call us. ° °5. Wound Care °a. Remove your bandages before your first bowel movement or 8 hours after surgery.     °b. Remove any wound packing material at this tim,e as well.  You do not need to repack the wound unless instructed otherwise.  Wear an absorbent pad or soft cotton gauze in your underwear to catch any drainage and help keep the area clean. You should change this every 2-3 hours while awake. °c. Keep the area clean and dry.  Bathe / shower every day, especially after bowel movements.  Keep the area clean by showering / bathing over the incision / wound.   It is okay to soak an open wound to help wash it.  Wet wipes or showers / gentle washing after bowel movements is often less traumatic than regular toilet paper. °d. You may have some styrofoam-like soft packing in the rectum which will come out with the first bowel movement.  °e. You will often notice bleeding with bowel movements.  This should slow down by the end of the first week of surgery °f. Expect some drainage.  This should slow down, too, by the end of the first week of surgery.  Wear an absorbent pad or soft cotton gauze in your underwear until the drainage stops. °g. Do Not sit on a rubber or pillow ring.  This can make you symptoms worse.  You may sit on a soft pillow if needed.  °6. ACTIVITIES as tolerated:   °a. You may resume regular (light) daily activities beginning the next day--such as daily self-care, walking, climbing stairs--gradually increasing activities as tolerated.  If you can walk 30 minutes without difficulty, it is safe to try more intense activity such as jogging, treadmill, bicycling, low-impact aerobics, swimming, etc. °b. Save the most intensive and strenuous activity for last such as sit-ups, heavy lifting, contact sports,  etc  Refrain from any heavy lifting or straining until you are off narcotics for pain control.   °c. You may drive when you are no longer taking prescription pain medication, you can comfortably sit for long periods of time, and you can safely maneuver your car and apply brakes. °d. You may have sexual intercourse when it is comfortable.  °7. FOLLOW UP in our office °a. Please call [facility] at [phone] to set up an appointment to see your surgeon in the office for a follow-up appointment approximately 3-4 weeks after your surgery. °b. Make sure that you call for this appointment the day you arrive home to insure a convenient appointment time. °10. IF YOU HAVE DISABILITY OR FAMILY LEAVE FORMS, BRING THEM TO THE OFFICE FOR PROCESSING.  DO NOT GIVE THEM TO YOUR DOCTOR. ° ° ° ° °WHEN TO CALL US [phone]: °1. Poor pain control °  2. Reactions / problems with new medications (rash/itching, nausea, etc)  °3. Fever over 101.5 F (38.5 C) °4. Inability to urinate °5. Nausea and/or vomiting °6. Worsening swelling or bruising °7. Continued bleeding from incision. °8. Increased pain, redness, or drainage from the incision ° °The clinic staff is available to answer your questions during regular business hours (8:30am-5pm).  Please don’t hesitate to call and ask to speak to one of our nurses for clinical concerns.   A surgeon from [facility] is always on call at the hospitals °  °If you have a medical emergency, go to the nearest emergency room or call 911. °  ° °[facility] °[address], [address], [place], [place]  [address] ? °MAIN: [phone] ? TOLL FREE: [phone] ? °FAX [phone] °[email or web address] ° ° °

## 2015-04-08 NOTE — Op Note (Signed)
04/08/2015  3:17 PM  PATIENT:  Andrew Gates  39 y.o. male  No care team member to display  PRE-OPERATIVE DIAGNOSIS:  PERIRECTAL ABCESS  POST-OPERATIVE DIAGNOSIS:  PERIRECTAL ABCESS  PROCEDURE: IRRIGATION AND DEBRIDEMENT PERIRECTAL ABSCESS   Surgeon(s): Romie LeveeAlicia Quincee Gittens, MD  ASSISTANT: none   ANESTHESIA:   local and general  SPECIMEN:  No Specimen  DISPOSITION OF SPECIMEN:  N/A  COUNTS:  YES  PLAN OF CARE: Discharge to home after PACU  PATIENT DISPOSITION:  PACU - hemodynamically stable.  INDICATION: 39 y.o. M with a posterior perirectal abscess   OR FINDINGS: posterior perirectal abscess  DESCRIPTION: the patient was identified in the preoperative holding area and taken to the OR where they were laid on the operating room table.  General anesthesia was induced without difficulty. The patient was then positioned in prone jackknife position with buttocks gently taped apart.  The patient was then prepped and draped in usual sterile fashion.  SCDs were noted to be in place prior to the initiation of anesthesia. A surgical timeout was performed indicating the correct patient, procedure, positioning and need for preoperative antibiotics.  A rectal block was performed using Marcaine with epinephrine.    I began with a digital rectal exam.  There was a small posterior mass.  I then placed a Hill-Ferguson anoscope into the anal canal and evaluated this completely.  There was a large, healed posterior anal fissure.  He had grade 3 internal hemorrhoids.  I made a small incision posteriorly and purulence was expressed.  The cavity was drained and then packed with iodoform gauze.  A dressing was applied.

## 2015-04-08 NOTE — H&P (Signed)
Andrew Gates is an 39 y.o. male.   Chief Complaint:  Rectal pain HPI: healthy male who started having pain Sunday evening.  He works 3rd shift.  Pain got worse yesterday.  He did have a BM without to much difficulty, he took some oxycodone from family member for pain yesterday, but it has become worse and he presents to the ED.  He has had some chills but pain is primary symptom. Work up in the ED shows he is afebrile, VSS, labs OK.  CT scan shows 28 x 16 x 22 mm is noted posterior to the inferior portion of the rectum consistent with abscess.  Attempted to drain at bedside unsuccessful. Examined by Dr. Marcello Moores and we now plan to take to the OR for I&D of abscess.    Past Medical History  Diagnosis Date  . Hemorrhoids     Past Surgical History  Procedure Laterality Date  . Incision and drainage perirectal abscess  09/22/11    Done at the bedside in the ER .  Marland Kitchen Incision and drainage perirectal abscess  03/30/2012    Procedure: IRRIGATION AND DEBRIDEMENT PERIRECTAL ABSCESS;  Surgeon: Madilyn Hook, DO;  Location: [facility];  Service: General;  Laterality: N/A;    No family history on file. Social History:  reports that he has been smoking Cigarettes.  He has been smoking about 0.50 packs per day. He has never used smokeless tobacco. He reports that he drinks alcohol. He reports that he does not use illicit drugs. Tobacco:  1PPD for 18+ years Drugs:  MJ weekly ETOH:  Social   Allergies: No Known Allergies   Prior to Admission medications   Medication Sig Start Date End Date Taking? Authorizing Provider  HYDROcodone-acetaminophen (NORCO/VICODIN) 5-325 MG per tablet Take 0.5 tablets by mouth once.   Yes Historical Provider, MD     Results for orders placed or performed during the hospital encounter of 04/08/15 (from the past 48 hour(s))  CBC with Differential     Status: None   Collection Time: 04/08/15  7:42 AM  Result Value Ref Range   WBC 9.5 4.0 - 10.5 K/uL   RBC 5.16 4.22 -  5.81 MIL/uL   Hemoglobin 13.7 13.0 - 17.0 g/dL   HCT 40.3 39.0 - 52.0 %   MCV 78.1 78.0 - 100.0 fL   MCH 26.6 26.0 - 34.0 pg   MCHC 34.0 30.0 - 36.0 g/dL   RDW 14.7 11.5 - 15.5 %   Platelets 185 150 - 400 K/uL   Neutrophils Relative % 73 43 - 77 %   Neutro Abs 6.8 1.7 - 7.7 K/uL   Lymphocytes Relative 17 12 - 46 %   Lymphs Abs 1.7 0.7 - 4.0 K/uL   Monocytes Relative 9 3 - 12 %   Monocytes Absolute 0.9 0.1 - 1.0 K/uL   Eosinophils Relative 1 0 - 5 %   Eosinophils Absolute 0.1 0.0 - 0.7 K/uL   Basophils Relative 0 0 - 1 %   Basophils Absolute 0.0 0.0 - 0.1 K/uL  Basic metabolic panel     Status: Abnormal   Collection Time: 04/08/15  7:42 AM  Result Value Ref Range   Sodium 137 135 - 145 mmol/L   Potassium 3.7 3.5 - 5.1 mmol/L   Chloride 104 101 - 111 mmol/L   CO2 23 22 - 32 mmol/L   Glucose, Bld 118 (H) 65 - 99 mg/dL   BUN 10 6 - 20 mg/dL   Creatinine, Ser  0.96 0.61 - 1.24 mg/dL   Calcium 8.7 (L) 8.9 - 10.3 mg/dL   GFR calc non Af Amer >60 >60 mL/min   GFR calc Af Amer >60 >60 mL/min    Comment: (NOTE) The eGFR has been calculated using the CKD EPI equation. This calculation has not been validated in all clinical situations. eGFR's persistently <60 mL/min signify possible Chronic Kidney Disease.    Anion gap 10 5 - 15   Ct Pelvis W Contrast  04/08/2015   CLINICAL DATA:  Rectal pain concerning for abscess.  EXAM: CT PELVIS WITH CONTRAST  TECHNIQUE: Multidetector CT imaging of the pelvis was performed using the standard protocol following the bolus administration of intravenous contrast.  CONTRAST:  153mL OMNIPAQUE IOHEXOL 300 MG/ML  SOLN  COMPARISON:  CT scan of January 10, 2015.  FINDINGS: The appendix appears normal. There is no evidence of bowel obstruction. Urinary bladder appears normal. No significant adenopathy is noted. No significant osseous abnormality is noted. Atherosclerosis of iliac arteries is noted without aneurysm formation. Prostate gland appears normal. Crescent  shaped fluid collection measuring 28 x 16 x 22 mm is noted posterior to the inferior portion of the rectum consistent with abscess. This was not present on the prior exam.  IMPRESSION: 28 x 22 x 16 mm crescent shaped fluid collection seen posterior to inferior portion of the rectum concerning for abscess.   Electronically Signed   By: Marijo Conception, M.D.   On: 04/08/2015 09:06    Review of Systems  Constitutional: Positive for chills. Negative for fever, weight loss, malaise/fatigue and diaphoresis.  HENT: Negative.   Eyes: Negative.   Respiratory: Negative.   Cardiovascular: Negative.   Gastrointestinal: Negative.  Negative for heartburn, nausea, vomiting, abdominal pain, diarrhea, constipation, blood in stool and melena.       Rectal pain started Sunday evening, + BM more pain yesterday, came in today.  Took family dose of oxycodone for pain last PM  Genitourinary: Negative.   Musculoskeletal: Negative.   Skin: Negative.   Neurological: Negative.  Negative for weakness.  Endo/Heme/Allergies: Negative.   Psychiatric/Behavioral: Negative.     Blood pressure 137/88, pulse 77, temperature 98.8 F (37.1 C), temperature source Oral, resp. rate 18, height $RemoveBe'5\' 10"'ICEidjgvP$  (1.778 m), weight 97.523 kg (215 lb), SpO2 99 %. Physical Exam  Constitutional: He is oriented to person, place, and time. He appears well-developed and well-nourished. No distress.  Perirectal pain only complaint.  HENT:  Head: Normocephalic and atraumatic.  Nose: Nose normal.  Eyes: Conjunctivae are normal. Right eye exhibits no discharge. Left eye exhibits no discharge. No scleral icterus.  Neck: Normal range of motion. Neck supple. No JVD present. No tracheal deviation present. No thyromegaly present.  Cardiovascular: Normal rate, regular rhythm, normal heart sounds and intact distal pulses.   No murmur heard. Respiratory: Effort normal and breath sounds normal. No respiratory distress. He has no wheezes. He has no rales. He  exhibits no tenderness.  GI: Soft. Bowel sounds are normal. He exhibits no distension and no mass. There is no tenderness. There is no rebound and no guarding.  Genitourinary:  Complains of perirectal pain, he has a hard time finding it.  Tender with palpable fluctuant area at 6 o'clock anterior position.  Complains of pain primarily on the the left side. Exam by Dr. Marcello Moores shows the site is posterior and plans to take to the OR for I&D of abscess.  Musculoskeletal: He exhibits no edema.  Lymphadenopathy:    He has  no cervical adenopathy.  Neurological: He is alert and oriented to person, place, and time. No cranial nerve deficit.  Skin: Skin is warm and dry. No rash noted. He is not diaphoretic. No erythema.  Psychiatric: He has a normal mood and affect. His behavior is normal. Judgment and thought content normal.     Assessment/Plan Perirectal abscess Tobacco use  Plan:   I&D of perirectal abscess.  Attempted to do at the bedside at area he felt was tender.  Site on CT is deeper and more posterior.  Exam by Dr. Marcello Moores and she plans to do in OR.    Daoud Lobue 04/08/2015, 10:41 AM

## 2015-04-08 NOTE — Progress Notes (Signed)
Dr Gerrit FriendsGerkin notified T 103 at 1800 now 102.4.  Orders received.

## 2015-04-08 NOTE — Anesthesia Procedure Notes (Signed)
Procedure Name: LMA Insertion Date/Time: 04/08/2015 2:30 PM Performed by: Paulla DollyJOYCE, Lux Skilton A Pre-anesthesia Checklist: Patient identified, Emergency Drugs available, Suction available, Patient being monitored and Timeout performed Patient Re-evaluated:Patient Re-evaluated prior to inductionOxygen Delivery Method: Circle system utilized Preoxygenation: Pre-oxygenation with 100% oxygen Intubation Type: Combination inhalational/ intravenous induction Ventilation: Mask ventilation without difficulty LMA: LMA with gastric port inserted LMA Size: 5.0 Number of attempts: 1 Placement Confirmation: positive ETCO2 and breath sounds checked- equal and bilateral Tube secured with: Tape Dental Injury: Teeth and Oropharynx as per pre-operative assessment

## 2015-04-08 NOTE — Anesthesia Postprocedure Evaluation (Signed)
  Anesthesia Post-op Note  Patient: Andrew Gates  Procedure(s) Performed: Procedure(s) (LRB): IRRIGATION AND DEBRIDEMENT PERIRECTAL ABSCESS (N/A)  Patient Location: PACU  Anesthesia Type: General  Level of Consciousness: awake and alert   Airway and Oxygen Therapy: Patient Spontanous Breathing  Post-op Pain: mild  Post-op Assessment: Post-op Vital signs reviewed, Patient's Cardiovascular Status Stable, Respiratory Function Stable, Patent Airway and No signs of Nausea or vomiting  Last Vitals:  Filed Vitals:   04/08/15 1929  BP:   Pulse:   Temp: 37.9 C  Resp:     Post-op Vital Signs: stable   Complications: No apparent anesthesia complications

## 2015-04-08 NOTE — ED Notes (Signed)
Surgery at bedside.

## 2015-04-08 NOTE — ED Notes (Signed)
Patient reports he has an abscess beside his rectum.  Reports he recently had a "cyst removed" and pain has returned.  He reports pain has gradually worsened over the past two days.

## 2015-04-08 NOTE — ED Provider Notes (Signed)
CSN: [id number]     Arrival date & time 6/28/[id number]  0612 History   First MD Initiated Contact with Patient 04/08/15 0703     Chief Complaint  Patient presents with  . Abscess     (Consider location/radiation/quality/duration/timing/severity/associated sxs/prior Treatment) Patient is a 39 y.o. male presenting with general illness.  Illness Location:  L rectal Quality:  Pain Severity:  Moderate Onset quality:  Gradual Duration:  3 days Timing:  Constant Progression:  Worsening Chronicity:  Recurrent Context:  Ho prior perirectal abscess Relieved by:  Nothing Worsened by:  Nothing Associated symptoms: fever (chills alone)   Associated symptoms: no abdominal pain, no cough, no diarrhea, no nausea and no vomiting     Past Medical History  Diagnosis Date  . Hemorrhoids    Past Surgical History  Procedure Laterality Date  . Incision and drainage perirectal abscess  09/22/11    Done at the bedside in the ER .  Marland Kitchen. Incision and drainage perirectal abscess  03/30/2012    Procedure: IRRIGATION AND DEBRIDEMENT PERIRECTAL ABSCESS;  Surgeon: Lodema PilotBrian Layton, DO;  Location: [facility];  Service: General;  Laterality: N/A;   No family history on file. History  Substance Use Topics  . Smoking status: Current Every Day Smoker -- 0.50 packs/day    Types: Cigarettes  . Smokeless tobacco: Never Used  . Alcohol Use: Yes     Comment: occasional    Review of Systems  Constitutional: Positive for fever (chills alone).  Respiratory: Negative for cough.   Gastrointestinal: Negative for nausea, vomiting, abdominal pain and diarrhea.  All other systems reviewed and are negative.     Allergies  Review of patient's allergies indicates no known allergies.  Home Medications   Prior to Admission medications   Medication Sig Start Date End Date Taking? Authorizing Provider  HYDROcodone-acetaminophen (NORCO/VICODIN) 5-325 MG per tablet Take 0.5 tablets by mouth once.   Yes Historical Provider, MD   oxyCODONE-acetaminophen (ROXICET) 5-325 MG per tablet Take 1-2 tablets by mouth every 4 (four) hours as needed for severe pain. 04/08/15   Romie LeveeAlicia Thomas, MD   BP 107/69 mmHg  Pulse 75  Temp(Src) 98 F (36.7 C) (Oral)  Resp 16  Ht 5\' 10"  (1.778 m)  Wt 215 lb (97.523 kg)  BMI 30.85 kg/m2  SpO2 98% Physical Exam  Constitutional: He is oriented to person, place, and time. He appears well-developed and well-nourished.  HENT:  Head: Normocephalic and atraumatic.  Eyes: Conjunctivae and EOM are normal.  Neck: Normal range of motion. Neck supple.  Cardiovascular: Normal rate, regular rhythm and normal heart sounds.   Pulmonary/Chest: Effort normal and breath sounds normal. No respiratory distress.  Abdominal: He exhibits no distension. There is no tenderness. There is no rebound and no guarding.  Genitourinary:  External rectal exam unremarkable  Musculoskeletal: Normal range of motion.  Neurological: He is alert and oriented to person, place, and time.  Skin: Skin is warm and dry.  Vitals reviewed.   ED Course  Procedures (including critical care time) Labs Review Labs Reviewed  BASIC METABOLIC PANEL - Abnormal; Notable for the following:    Glucose, Bld 118 (*)    Calcium 8.7 (*)    All other components within normal limits  CBC WITH DIFFERENTIAL/PLATELET    Imaging Review Ct Pelvis W Contrast  04/08/2015   CLINICAL DATA:  Rectal pain concerning for abscess.  EXAM: CT PELVIS WITH CONTRAST  TECHNIQUE: Multidetector CT imaging of the pelvis was performed using the standard protocol following  the bolus administration of intravenous contrast.  CONTRAST:  OMNIPAQUE IOHEXOL 300 MG/ML  SOLN  COMPARISON:  CT scan of January 10, 2015.  FINDINGS: The appendix appears normal. There is no evidence of bowel obstruction. Urinary bladder appears normal. No significant adenopathy is noted. No significant osseous abnormality is noted. Atherosclerosis of iliac arteries is noted without aneurysm  formation. Prostate gland appears normal. Crescent shaped fluid collection measuring 28 x 16 x 22 mm is noted posterior to the inferior portion of the rectum consistent with abscess. This was not present on the prior exam.  IMPRESSION: 28 x 22 x 16 mm crescent shaped fluid collection seen posterior to inferior portion of the rectum concerning for abscess.   Electronically Signed   By: Lupita Raider, M.D.   On: 04/08/2015 09:06     EKG Interpretation None      MDM   Final diagnoses:  Perirectal abscess    39 y.o. male with pertinent PMH of prior perirectal abscess sp I&D by surgery x 2 presents with recurrent symptoms of same.  External exam unremarkable.  Given history will obtain CT scan of pelvis.    CT scan with perirectal abscess.  Consulted surgery and pt admitted.  I have reviewed all laboratory and imaging studies if ordered as above  1. Perirectal abscess         Mirian Mo, MD 04/09/15 [phone]

## 2015-04-08 NOTE — Anesthesia Preprocedure Evaluation (Signed)
Anesthesia Evaluation  Patient identified by MRN, date of birth, ID band Patient awake    Reviewed: Allergy & Precautions, H&P , NPO status , Patient's Chart, lab work & pertinent test results  Airway Mallampati: II  TM Distance: >3 FB Neck ROM: full    Dental no notable dental hx. (+) Teeth Intact, Dental Advisory Given   Pulmonary neg pulmonary ROS, Current Smoker,  breath sounds clear to auscultation  Pulmonary exam normal       Cardiovascular Exercise Tolerance: Good negative cardio ROS Normal cardiovascular examRhythm:regular Rate:Normal     Neuro/Psych negative neurological ROS  negative psych ROS   GI/Hepatic negative GI ROS, Neg liver ROS,   Endo/Other  negative endocrine ROS  Renal/GU negative Renal ROS  negative genitourinary   Musculoskeletal   Abdominal   Peds  Hematology negative hematology ROS (+)   Anesthesia Other Findings   Reproductive/Obstetrics negative OB ROS                             Anesthesia Physical Anesthesia Plan  ASA: II  Anesthesia Plan: General   Post-op Pain Management:    Induction: Intravenous  Airway Management Planned: LMA  Additional Equipment:   Intra-op Plan:   Post-operative Plan:   Informed Consent: I have reviewed the patients History and Physical, chart, labs and discussed the procedure including the risks, benefits and alternatives for the proposed anesthesia with the patient or authorized representative who has indicated his/her understanding and acceptance.   Dental Advisory Given  Plan Discussed with: CRNA and Surgeon  Anesthesia Plan Comments:         Anesthesia Quick Evaluation

## 2015-04-09 ENCOUNTER — Encounter (HOSPITAL_COMMUNITY): Payer: Self-pay | Admitting: General Surgery

## 2015-04-09 MED ORDER — OXYCODONE-ACETAMINOPHEN 5-325 MG PO TABS: 1.0000 | ORAL_TABLET | ORAL | Status: DC | PRN

## 2015-04-09 MED ORDER — IBUPROFEN 200 MG PO TABS: ORAL_TABLET | ORAL | Status: AC

## 2015-04-09 MED ORDER — ACETAMINOPHEN 325 MG PO TABS: 650.0000 mg | ORAL_TABLET | ORAL | Status: AC | PRN

## 2015-04-09 NOTE — Progress Notes (Signed)
1 Day Post-Op  Subjective: He feels much better, a bit sore, but otherwise he is doing well.  Ordering breakfast.    Objective: Vital signs in last 24 hours: Temp:  [98 F (36.7 C)-103 F (39.4 C)] 98 F (36.7 C) (06/29 0540) Pulse Rate:  [73-128] 75 (06/29 0540) Resp:  [3-26] 16 (06/29 0540) BP: (107-144)/(62-95) 107/69 mmHg (06/29 0540) SpO2:  [87 %-100 %] 98 % (06/29 0540) Last BM Date: 04/06/15 2124 PO  Regular diet Tm 102.4  Temp down now, VSS No labs Intake/Output from previous day: 06/28 0701 - 06/29 0700 In: 4133.2 [P.O.:2124; I.V.:1959.2; IV Piggyback:50] Out: 2500 [Urine:2500] Intake/Output this shift:    General appearance: alert, cooperative and no distress Skin: Dresssing removed, packing left in place.  No erythema.  Much more comfortable than yesterday.  Lab Results:   Recent Labs  04/08/15 0742  WBC 9.5  HGB 13.7  HCT 40.3  PLT 185    BMET  Recent Labs  04/08/15 0742  NA 137  K 3.7  CL 104  CO2 23  GLUCOSE 118*  BUN 10  CREATININE 0.96  CALCIUM 8.7*   PT/INR No results for input(s): LABPROT, INR in the last 72 hours.  No results for input(s): AST, ALT, ALKPHOS, BILITOT, PROT, ALBUMIN in the last 168 hours.   Lipase  No results found for: LIPASE   Studies/Results: Ct Pelvis W Contrast  04/08/2015   CLINICAL DATA:  Rectal pain concerning for abscess.  EXAM: CT PELVIS WITH CONTRAST  TECHNIQUE: Multidetector CT imaging of the pelvis was performed using the standard protocol following the bolus administration of intravenous contrast.  CONTRAST:  100mL OMNIPAQUE IOHEXOL 300 MG/ML  SOLN  COMPARISON:  CT scan of January 10, 2015.  FINDINGS: The appendix appears normal. There is no evidence of bowel obstruction. Urinary bladder appears normal. No significant adenopathy is noted. No significant osseous abnormality is noted. Atherosclerosis of iliac arteries is noted without aneurysm formation. Prostate gland appears normal. Crescent shaped fluid  collection measuring 28 x 16 x 22 mm is noted posterior to the inferior portion of the rectum consistent with abscess. This was not present on the prior exam.  IMPRESSION: 28 x 22 x 16 mm crescent shaped fluid collection seen posterior to inferior portion of the rectum concerning for abscess.   Electronically Signed   By: Lupita RaiderJames  Green Jr, M.D.   On: 04/08/2015 09:06    Medications: . ampicillin-sulbactam (UNASYN) IV  3 g Intravenous Q6H  . lidocaine-EPINEPHrine  20 mL Other Once  . sodium chloride  3 mL Intravenous Q12H    Assessment/Plan PERIRECTAL ABCESS IRRIGATION AND DEBRIDEMENT PERIRECTAL ABSCESS,04/08/15,Dr. Romie LeveeAlicia Thomas   Plan:  Home after breakfast.  Dressing will come out with BM.  He will follow up with Dr. Maisie Fushomas in the clinic.        Parlee Amescua 04/09/2015

## 2015-04-09 NOTE — Progress Notes (Signed)
Pt given d/c instructions, prescriptions, med list, f/u appts, when to call MD, s/s infection, pain control, diet, wound care, activity--pt verbalized understanding and asked appropriate questions.  Pt's pain at tolerable level.  Awaiting ABX to finish, then will d/c home.  Belongings packed.

## 2015-04-10 NOTE — Discharge Summary (Signed)
Physician Discharge Summary  Patient ID: Andrew Gates MRN: [id number] DOB/AGE: 1976/07/27 39 y.o.  Admit date: 04/08/2015 Discharge date: 04/10/2015  Admission Diagnoses:  Perirectal abscess Tobacco use Discharge Diagnoses:  Same  Active Problems:   Perirectal abscess   Rectal abscess   PROCEDURES:  IRRIGATION AND DEBRIDEMENT PERIRECTAL ABSCESS, 04/08/15, Dr. Cristi LoronAlicia Thomas  Hospital Course: healthy male who started having pain Sunday evening. He works 3rd shift. Pain got worse yesterday. He did have a BM without to much difficulty, he took some oxycodone from family member for pain yesterday, but it has become worse and he presents to the ED. He has had some chills but pain is primary symptom. Work up in the ED shows he is afebrile, VSS, labs OK. CT scan shows 28 x 16 x 22 mm is noted posterior to the inferior portion of the rectum consistent with abscess. Attempted to drain at bedside unsuccessful. Examined by Dr. Maisie Fushomas and we now plan to take to the OR for I&D of abscess.  Pt taken to the OR later that day, and tolerated the procedure well.  He was mobilized, he had some fever post procedure, so we kept him over night.  The fever resolved he did well and was discharged home the following AM.  Dressing will come out with BM.  Instructed to do several sitz baths per day.  Follow up in 2 weeks.  Condition on d/c:  Improved   Disposition: 01-Home or Self Care     Medication List    STOP taking these medications        HYDROcodone-acetaminophen 5-325 MG per tablet  Commonly known as:  NORCO/VICODIN      TAKE these medications        acetaminophen 325 MG tablet  Commonly known as:  TYLENOL  Take 2 tablets (650 mg total) by mouth every 4 (four) hours as needed for mild pain or moderate pain (or Fever >/= 101).     ibuprofen 200 MG tablet  Commonly known as:  MOTRIN IB  You can take 2-3 tablets every 6 hours as needed for pain.     oxyCODONE-acetaminophen 5-325  MG per tablet  Commonly known as:  ROXICET  Take 1-2 tablets by mouth every 4 (four) hours as needed for severe pain.           Follow-up Information    Follow up with Vanita PandaHOMAS, ALICIA C., MD. Schedule an appointment as soon as possible for a visit in 3 weeks.   Specialty:  General Surgery   Contact information:   [address] [place] [address] [phone]       Signed: Sherrie GeorgeJENNINGS,Raunak Antuna 04/10/2015, 2:19 PM

## 2016-12-19 IMAGING — CT CT PELVIS W/ CM
2 of 3 series · 17 of 46 positions shown, 19 images · IV contrast (omnipaque)
Comparison: CT scan of January 10, 2015.

CLINICAL DATA: Rectal pain concerning for abscess.

EXAM:
CT PELVIS WITH CONTRAST
TECHNIQUE: Multidetector CT imaging of the pelvis was performed using the
standard protocol following the bolus administration of intravenous
contrast.
CONTRAST:  100mL OMNIPAQUE IOHEXOL 300 MG/ML  SOLN

[Series 2: pelvis with · axial · 0.85mm/px · z∈[-359,-64]mm · 14 of 69 slices shown, 16 images]
[im 5/69  soft-tissue]
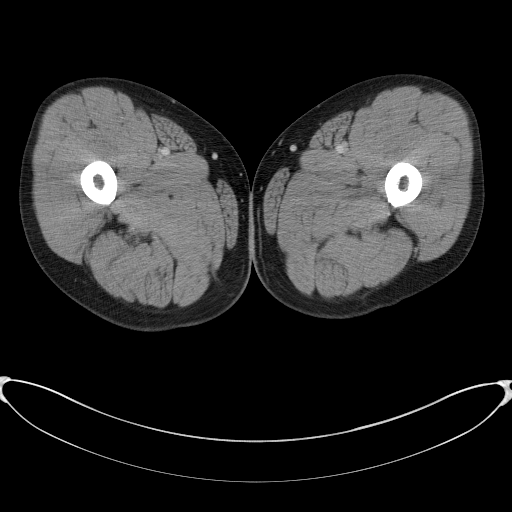
[im 5/69  bone]
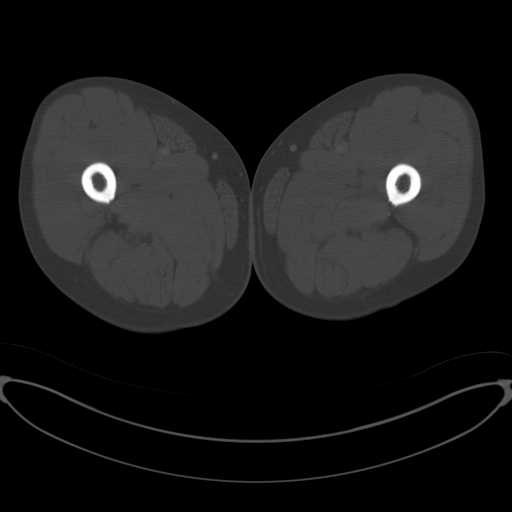
[im 9/69  soft-tissue]
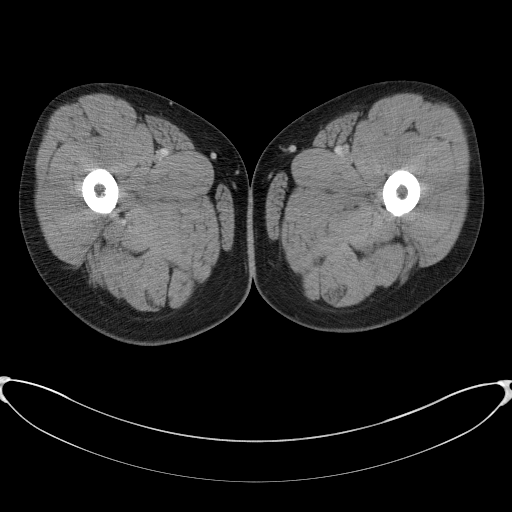
[im 14/69  soft-tissue]
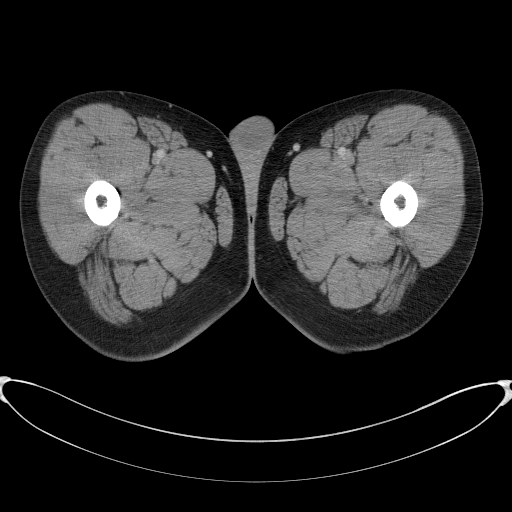
[im 18/69  soft-tissue]
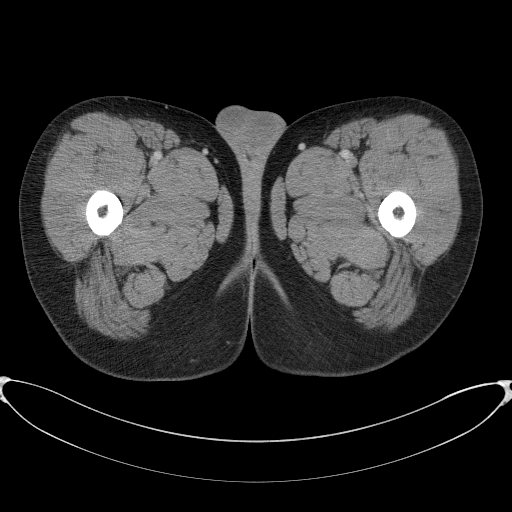
[im 22/69  soft-tissue]
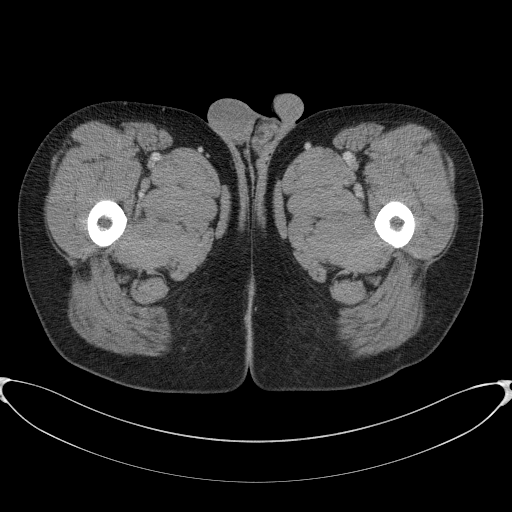
[im 27/69  soft-tissue]
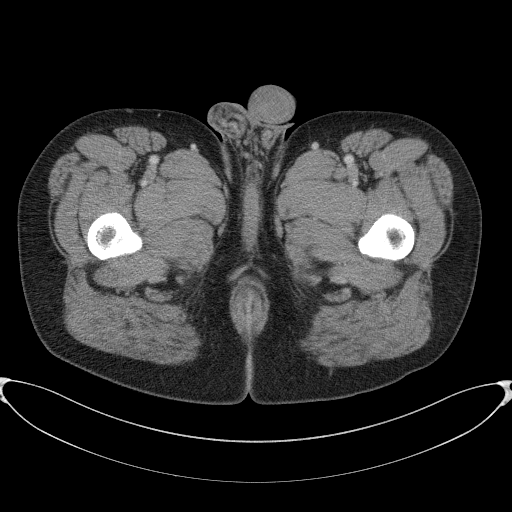
[im 31/69  soft-tissue]
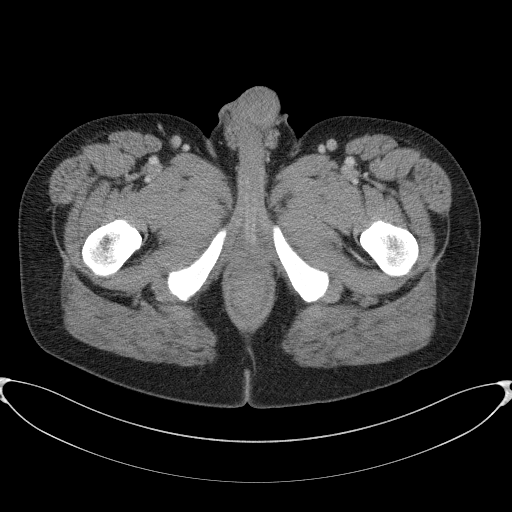
[im 38/69  soft-tissue]
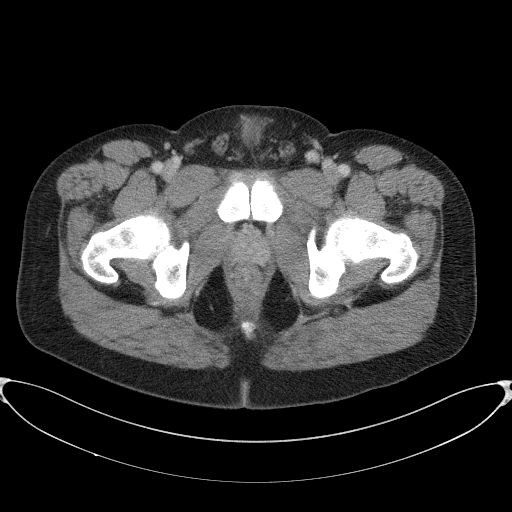
[im 42/69  soft-tissue]
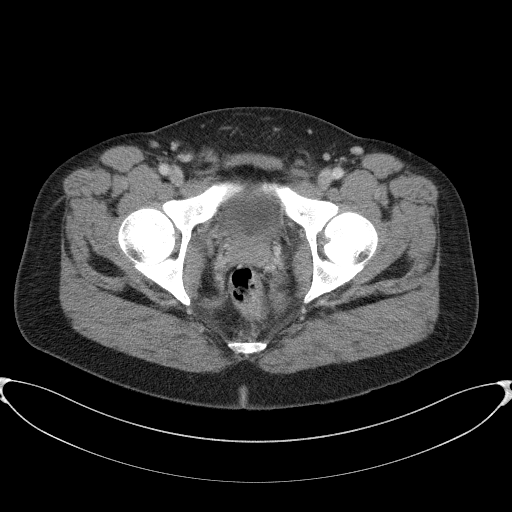
[im 42/69  bone]
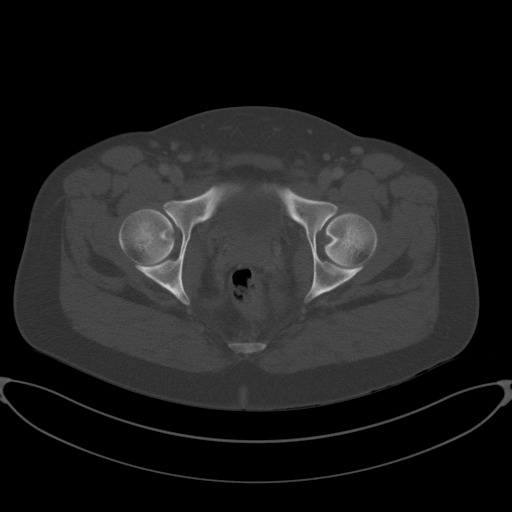
[im 47/69  soft-tissue]
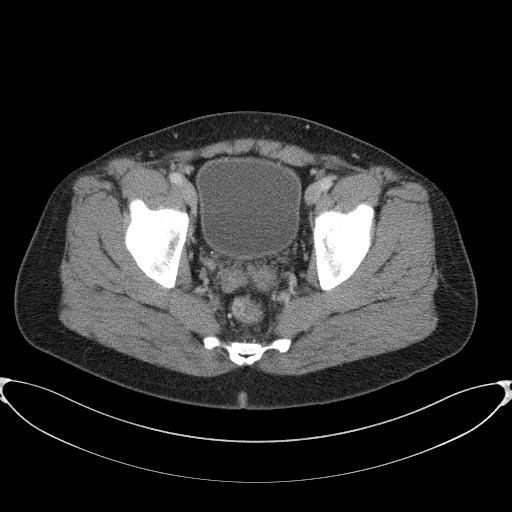
[im 51/69  soft-tissue]
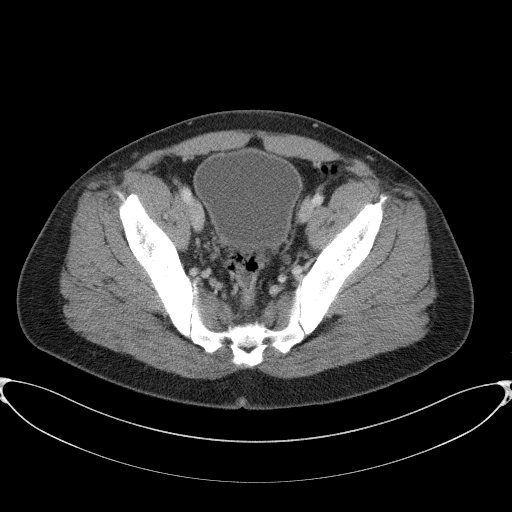
[im 55/69  soft-tissue]
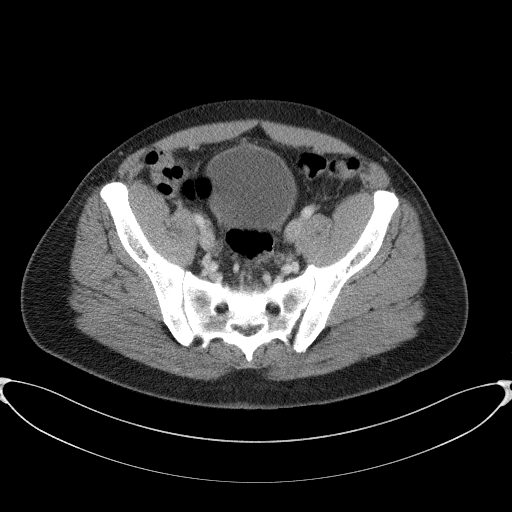
[im 60/69  soft-tissue]
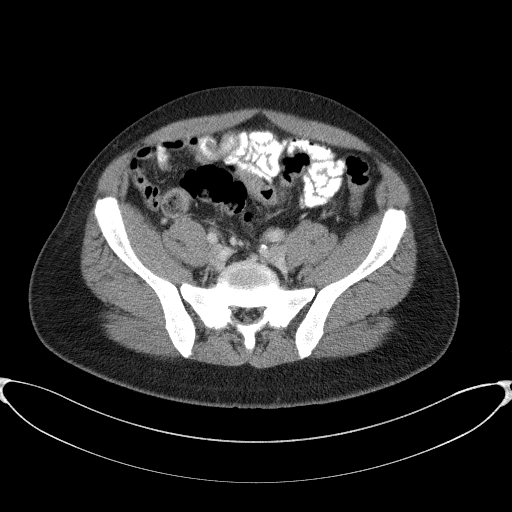
[im 64/69  soft-tissue]
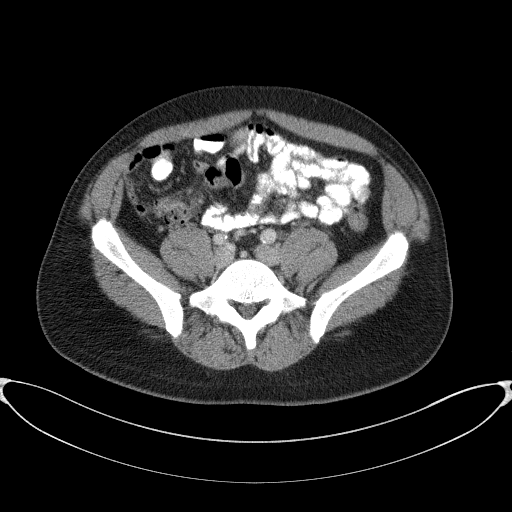

[Series 4: coronal images · coronal · 0.68mm/px · 3 of 98 slices shown]
[im 33/98  soft-tissue]
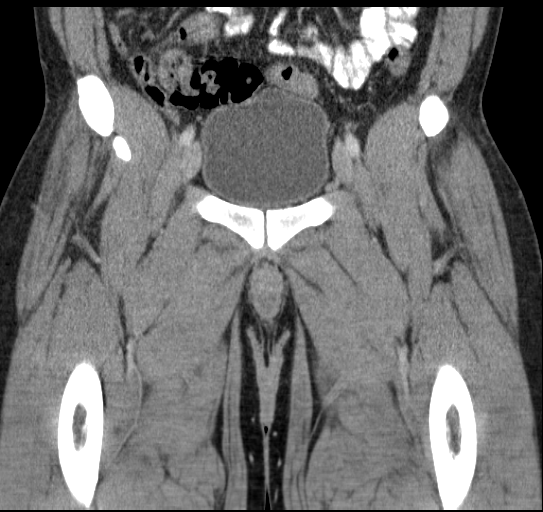
[im 44/98  soft-tissue]
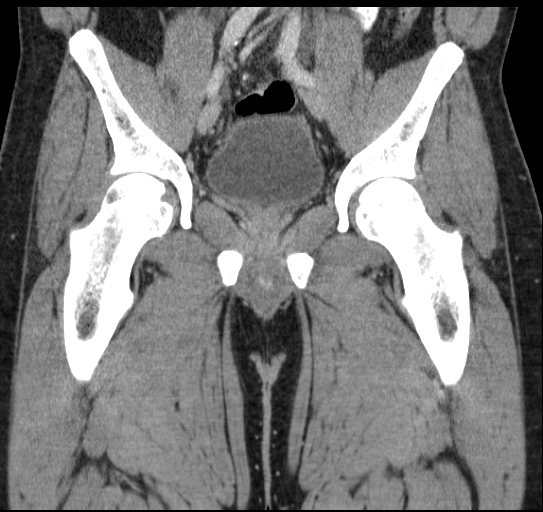
[im 54/98  soft-tissue]
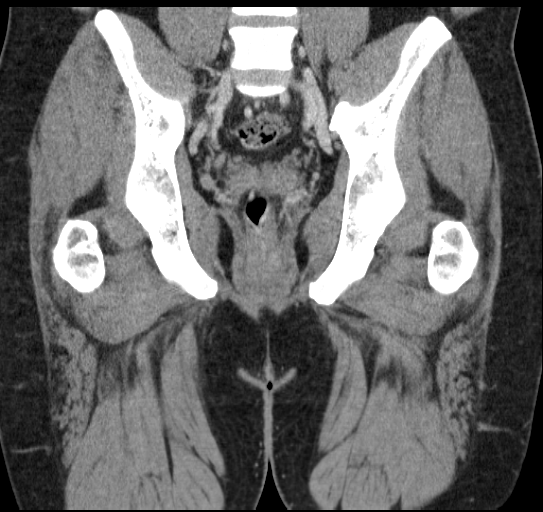

[17 of 46 positions shown; findings below may reference images not displayed]

FINDINGS: The appendix appears normal. There is no evidence of bowel
obstruction. Urinary bladder appears normal. No significant
adenopathy is noted. No significant osseous abnormality is noted.
Atherosclerosis of iliac arteries is noted without aneurysm
formation. Prostate gland appears normal. Crescent shaped fluid
collection measuring 28 x 16 x 22 mm is noted posterior to the
inferior portion of the rectum consistent with abscess. This was not
present on the prior exam.
IMPRESSION: 28 x 22 x 16 mm crescent shaped fluid collection seen posterior to
inferior portion of the rectum concerning for abscess.

## 2017-06-16 ENCOUNTER — Ambulatory Visit (INDEPENDENT_AMBULATORY_CARE_PROVIDER_SITE_OTHER): Payer: BLUE CROSS/BLUE SHIELD | Admitting: Family Medicine

## 2017-06-16 ENCOUNTER — Encounter: Payer: Self-pay | Admitting: Family Medicine

## 2017-06-16 VITALS — BP 124/83 | HR 70 | Temp 98.0°F | Resp 18 | Ht 70.59 in | Wt 202.8 lb

## 2017-06-16 DIAGNOSIS — R079 Chest pain, unspecified: Secondary | ICD-10-CM

## 2017-06-16 DIAGNOSIS — R1032 Left lower quadrant pain: Secondary | ICD-10-CM | POA: Diagnosis not present

## 2017-06-16 DIAGNOSIS — R634 Abnormal weight loss: Secondary | ICD-10-CM

## 2017-06-16 DIAGNOSIS — R197 Diarrhea, unspecified: Secondary | ICD-10-CM

## 2017-06-16 NOTE — Progress Notes (Signed)
9/6/201811:22 AM  [name] [birth year], 41 y.o. male [id number]  Chief Complaint  Patient presents with  . Arm Pain    X 1 mth- right arm pain  . Chest Pain    X 2-3 weeks- pt states that he think its stress    HPI:   Patient is a 41 y.o. male with no significant past medical history who presents today with several concerns. Patient reports that he has been under significant amount of stress for several months now.   He is really concerned with about a 20 lbs weight loss, he attributes this to decreased appetite. He denies early satiety, abdominal pain, nausea, vomiting. He reports change in stool, not as formed, looser, occasionally bright red blood in stool, reports hemorrhoids.   He reports his grandmother had colon cancer, she died at age 43. He denies any fhx IBD.  He also has been having chest discomfort, lower part of sternum, at end of deep exhale or when lying down. It self resolves, last about 2-3 minutes. He denies any diaphoresis, SOB or radiation of pain. He denies any chest pain with physical exertion, his job requires regular heavy lifting and climbing of stairs and he does not experience any chest pain/presssure. He denies any fhx of early CAD.  He smokes about 1/2 ppd, drinks about a 6 pack a day, smoke TSH, denies any other drug use, denies IVDU. Works 2 full time jobs. Tries to exercise.   Depression screen PHQ 2/9 06/16/2017  Decreased Interest 0  Down, Depressed, Hopeless 1  PHQ - 2 Score 1  Altered sleeping 1  Tired, decreased energy 1  Change in appetite 2  Feeling bad or failure about yourself  0  Trouble concentrating 0  Moving slowly or fidgety/restless 0  Suicidal thoughts 0  PHQ-9 Score 5  Difficult doing work/chores Not difficult at all    No Known Allergies  Current Outpatient Prescriptions on File Prior to Visit  Medication Sig Dispense Refill  . acetaminophen (TYLENOL) 325 MG tablet Take 2 tablets (650 mg total) by mouth every 4  (four) hours as needed for mild pain or moderate pain (or Fever >/= 101).    Marland Kitchen. ibuprofen (MOTRIN IB) 200 MG tablet You can take 2-3 tablets every 6 hours as needed for pain. 30 tablet 0  . oxyCODONE-acetaminophen (ROXICET) 5-325 MG per tablet Take 1-2 tablets by mouth every 4 (four) hours as needed for severe pain. (Patient not taking: Reported on 06/16/2017) 40 tablet 0   No current facility-administered medications on file prior to visit.     Past Medical History:  Diagnosis Date  . Hemorrhoids     Past Surgical History:  Procedure Laterality Date  . EYE SURGERY    . INCISION AND DRAINAGE PERIRECTAL ABSCESS  09/22/11   Done at the bedside in the ER .  Marland Kitchen. INCISION AND DRAINAGE PERIRECTAL ABSCESS  03/30/2012   Procedure: IRRIGATION AND DEBRIDEMENT PERIRECTAL ABSCESS;  Surgeon: Lodema PilotBrian Layton, DO;  Location: [facility];  Service: General;  Laterality: N/A;  . INCISION AND DRAINAGE PERIRECTAL ABSCESS N/A 04/08/2015   Procedure: IRRIGATION AND DEBRIDEMENT PERIRECTAL ABSCESS;  Surgeon: Romie LeveeAlicia Thomas, MD;  Location: [facility];  Service: General;  Laterality: N/A;    Social History  Substance Use Topics  . Smoking status: Current Every Day Smoker    Packs/day: 0.50    Types: Cigarettes  . Smokeless tobacco: Never Used  . Alcohol use 2.4 oz/week    4 Cans of beer  per week     Comment: occasional    Family History  Problem Relation Age of Onset  . Hypertension Mother   . Cancer Paternal Grandmother     Review of Systems  Constitutional: Positive for diaphoresis and weight loss. Negative for chills, fever and malaise/fatigue.  HENT: Negative for congestion, ear pain and sore throat.   Respiratory: Negative for cough, shortness of breath and wheezing.   Cardiovascular: Positive for chest pain. Negative for palpitations and leg swelling.  Gastrointestinal: Positive for blood in stool, diarrhea and heartburn. Negative for abdominal pain, constipation, melena, nausea and vomiting.  Genitourinary:  Negative for dysuria and hematuria.  Musculoskeletal: Negative for myalgias.  Psychiatric/Behavioral: Positive for hallucinations. Negative for depression. The patient is nervous/anxious.      OBJECTIVE:  Blood pressure 124/83, pulse 70, temperature 98 F (36.7 C), temperature source Oral, resp. rate 18, height 5' 10.59" (1.793 m), weight 202 lb 12.8 oz (92 kg), SpO2 99 %.  Physical Exam  Constitutional: He is oriented to person, place, and time and well-developed, well-nourished, and in no distress.  HENT:  Head: Normocephalic and atraumatic.  Right Ear: Hearing, tympanic membrane, external ear and ear canal normal.  Left Ear: Hearing, tympanic membrane, external ear and ear canal normal.  Mouth/Throat: Oropharynx is clear and moist. No oropharyngeal exudate.  Eyes: Pupils are equal, round, and reactive to light. Conjunctivae and EOM are normal.  Neck: Neck supple. No thyromegaly present.  Cardiovascular: Normal rate, regular rhythm, normal heart sounds and intact distal pulses.  Exam reveals no gallop and no friction rub.   No murmur heard. Pulmonary/Chest: Effort normal and breath sounds normal. He has no wheezes. He has no rales. He exhibits tenderness (Left 10th costosternal joint ).  Abdominal: Soft. Bowel sounds are normal. He exhibits no distension and no mass. There is tenderness (LLQ). There is no rebound.  Musculoskeletal: He exhibits no edema.  Lymphadenopathy:    He has no cervical adenopathy.  Neurological: He is alert and oriented to person, place, and time. Gait normal.  Skin: Skin is warm and dry.     ASSESSMENT and PLAN:  1. Loss of weight Might be related to stress but need to r/o more concerning etiologies first. Discussed increasing calories.  - CBC with Differential - Comprehensive metabolic panel - TSH - HIV antibody (with reflex) - Fecal occult blood, imunochemical  2. Chest pain in adult Atypical. Most likely costochondritis per exam finding,  discussed conservative management.  - EKG 12-Lead , short PR interval otherwise no ST changes.  3. Abdominal pain, LLQ On exam only, patient denies pain otherwise,ct scan done in 2016 did not mention diverticulosis, re-evaluate at next visit  4. Diarrhea, unspecified type Pending labs consider referral to GI as colonoscopy might also be needed given if no cause for weight loss found.   FU 1 week    Myles Lipps, MD Primary Care at [facility] [address] [place], [place] [address] Ph.  [phone] Fax [phone]

## 2017-06-17 LAB — COMPREHENSIVE METABOLIC PANEL
ALT: 17 IU/L (ref 0–44)
AST: 25 IU/L (ref 0–40)
Albumin/Globulin Ratio: 1.6 (ref 1.2–2.2)
Albumin: 4.6 g/dL (ref 3.5–5.5)
Alkaline Phosphatase: 103 IU/L (ref 39–117)
BUN/Creatinine Ratio: 10 (ref 9–20)
BUN: 9 mg/dL (ref 6–24)
Bilirubin Total: 0.5 mg/dL (ref 0.0–1.2)
CO2: 23 mmol/L (ref 20–29)
Calcium: 9.7 mg/dL (ref 8.7–10.2)
Chloride: 101 mmol/L (ref 96–106)
Creatinine, Ser: 0.89 mg/dL (ref 0.76–1.27)
GFR calc Af Amer: 122 mL/min/{1.73_m2} (ref >59)
GFR calc non Af Amer: 105 mL/min/{1.73_m2} (ref >59)
Globulin, Total: 2.9 g/dL (ref 1.5–4.5)
Glucose: 89 mg/dL (ref 65–99)
Potassium: 4.1 mmol/L (ref 3.5–5.2)
Sodium: 140 mmol/L (ref 134–144)
Total Protein: 7.5 g/dL (ref 6.0–8.5)

## 2017-06-17 LAB — CBC WITH DIFFERENTIAL/PLATELET
Basophils Absolute: 0 10*3/uL (ref 0.0–0.2)
Basos: 0 %
EOS (ABSOLUTE): 0.1 10*3/uL (ref 0.0–0.4)
Eos: 2 %
Hematocrit: 45.1 % (ref 37.5–51.0)
Hemoglobin: 14.8 g/dL (ref 13.0–17.7)
Immature Grans (Abs): 0 10*3/uL (ref 0.0–0.1)
Immature Granulocytes: 0 %
Lymphocytes Absolute: 1.9 10*3/uL (ref 0.7–3.1)
Lymphs: 37 %
MCH: 26.8 pg (ref 26.6–33.0)
MCHC: 32.8 g/dL (ref 31.5–35.7)
MCV: 82 fL (ref 79–97)
Monocytes Absolute: 0.5 10*3/uL (ref 0.1–0.9)
Monocytes: 10 %
Neutrophils Absolute: 2.6 10*3/uL (ref 1.4–7.0)
Neutrophils: 51 %
Platelets: 188 10*3/uL (ref 150–379)
RBC: 5.53 x10E6/uL (ref 4.14–5.80)
RDW: 16.8 % — ABNORMAL HIGH (ref 12.3–15.4)
WBC: 5.1 10*3/uL (ref 3.4–10.8)

## 2017-06-17 LAB — TSH: TSH: 0.616 u[IU]/mL (ref 0.450–4.500)

## 2017-06-17 LAB — HIV ANTIBODY (ROUTINE TESTING W REFLEX): HIV Screen 4th Generation wRfx: NONREACTIVE

## 2017-06-21 LAB — POC HEMOCCULT BLD/STL (HOME/3-CARD/SCREEN)
Card #2 Fecal Occult Blod, POC: NEGATIVE
Card #3 Fecal Occult Blood, POC: NEGATIVE
Fecal Occult Blood, POC: NEGATIVE

## 2017-06-21 NOTE — Addendum Note (Signed)
Addended by: MCNEILL, Meleana Commerford A on: 06/21/2017 03:45 PM   Modules accepted: Orders

## 2017-06-21 NOTE — Addendum Note (Signed)
Addended by: Baldwin CrownJOHNSON, SHAQUETTA D on: 06/21/2017 03:54 PM   Modules accepted: Orders

## 2017-06-21 NOTE — Addendum Note (Signed)
Addended by: MCNEILL, Tyqwan Pink A on: 06/21/2017 04:46 PM   Modules accepted: Orders

## 2017-06-23 ENCOUNTER — Encounter: Payer: Self-pay | Admitting: Family Medicine

## 2017-06-23 ENCOUNTER — Ambulatory Visit (INDEPENDENT_AMBULATORY_CARE_PROVIDER_SITE_OTHER): Payer: BLUE CROSS/BLUE SHIELD | Admitting: Family Medicine

## 2017-06-23 ENCOUNTER — Ambulatory Visit: Payer: BLUE CROSS/BLUE SHIELD | Admitting: Family Medicine

## 2017-06-23 VITALS — BP 138/90 | HR 87 | Temp 98.3°F | Resp 18 | Ht 70.69 in | Wt 202.6 lb

## 2017-06-23 DIAGNOSIS — Z0000 Encounter for general adult medical examination without abnormal findings: Secondary | ICD-10-CM | POA: Diagnosis not present

## 2017-06-23 DIAGNOSIS — M7711 Lateral epicondylitis, right elbow: Secondary | ICD-10-CM | POA: Diagnosis not present

## 2017-06-23 NOTE — Progress Notes (Signed)
9/13/201811:35 AM  Andrew Gates 05-02-1976, 41 y.o. male [id number]  Chief Complaint  Patient presents with  . Annual Exam    HPI:   Patient is a 41 y.o. male with no significant past medical history who presents today for his CPE. Since our last visit, patient's appetite has improved, weight has stabilized. Today he tells me that he believes that his weight loss was due to grief with possible depression over recent loss. Ir was starting to affect his work and he became worried as he has never grieved before. He states that currently he is coping better, feeling more himself. Engaging in activities of self-care.   Otherwise c/o right forearm pain. Intermittent, no swelling or redness, no tingling or weakness, no trauma, does sign amount of lifting. 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   Depression screen PHQ 2/9 06/16/2017  Decreased Interest 0  Down, Depressed, Hopeless 1  PHQ - 2 Score 1  Altered sleeping 1  Tired, decreased energy 1  Change in appetite 2  Feeling bad or failure about yourself  0  Trouble concentrating 0  Moving slowly or fidgety/restless 0  Suicidal thoughts 0  PHQ-9 Score 5  Difficult doing work/chores Not difficult at all    No Known Allergies  Current Outpatient Prescriptions on File Prior to Visit  Medication Sig Dispense Refill  . acetaminophen (TYLENOL) 325 MG tablet Take 2 tablets (650 mg total) by mouth every 4 (four) hours as needed for mild pain or moderate pain (or Fever >/= 101).    Marland Kitchen ibuprofen (MOTRIN IB) 200 MG tablet You can take 2-3 tablets every 6 hours as needed for pain. 30 tablet 0   No current facility-administered medications on file prior to visit.     Past Medical History:  Diagnosis Date  . Hemorrhoids     Past Surgical History:  Procedure Laterality Date  . EYE SURGERY    . INCISION AND DRAINAGE PERIRECTAL ABSCESS  09/22/11   Done at the bedside in the ER .  Marland Kitchen INCISION AND DRAINAGE  PERIRECTAL ABSCESS  03/30/2012   Procedure: IRRIGATION AND DEBRIDEMENT PERIRECTAL ABSCESS;  Surgeon: Lodema Pilot, DO;  Location: [facility];  Service: General;  Laterality: N/A;  . INCISION AND DRAINAGE PERIRECTAL ABSCESS N/A 04/08/2015   Procedure: IRRIGATION AND DEBRIDEMENT PERIRECTAL ABSCESS;  Surgeon: Romie Levee, MD;  Location: [facility];  Service: General;  Laterality: N/A;    Social History  Substance Use Topics  . Smoking status: Current Every Day Smoker    Packs/day: 0.50    Types: Cigarettes  . Smokeless tobacco: Never Used  . Alcohol use 2.4 oz/week    4 Cans of beer per week     Comment: occasional    Family History  Problem Relation Age of Onset  . Hypertension Mother   . Cancer Paternal Grandmother     Review of Systems  Constitutional: Negative for chills and fever.  Respiratory: Negative for cough and shortness of breath.   Cardiovascular: Negative for chest pain, palpitations and leg swelling.  Gastrointestinal: Negative for abdominal pain, constipation, diarrhea, nausea and vomiting.  Genitourinary: Negative for dysuria and hematuria.  Musculoskeletal: Positive for myalgias.  Neurological: Negative for tingling, sensory change, speech change and focal weakness.  Psychiatric/Behavioral: Negative for substance abuse and suicidal ideas.     OBJECTIVE:  Blood pressure 138/90, pulse 87, temperature 98.3 F (36.8 C), temperature source Oral, resp. rate 18, height 5' 10.69" (1.796 m), weight 202 lb  9.6 oz (91.9 kg), SpO2 98 %.  Physical Exam  Constitutional: He is oriented to person, place, and time and well-developed, well-nourished, and in no distress.  HENT:  Head: Normocephalic and atraumatic.  Right Ear: Hearing, tympanic membrane, external ear and ear canal normal.  Left Ear: Hearing, tympanic membrane, external ear and ear canal normal.  Mouth/Throat: Oropharynx is clear and moist. No oropharyngeal exudate.  Eyes: Pupils are equal, round, and reactive to  light. Conjunctivae and EOM are normal.  Neck: Neck supple. No thyromegaly present.  Cardiovascular: Normal rate and regular rhythm.  Exam reveals no gallop and no friction rub.   No murmur heard. Pulmonary/Chest: Effort normal and breath sounds normal. He has no wheezes. He has no rales.  Abdominal: Soft. Bowel sounds are normal. He exhibits no distension and no mass. There is no tenderness.  Musculoskeletal: Normal range of motion. He exhibits tenderness (Right lateral epicondyle). He exhibits no edema or deformity.  Lymphadenopathy:    He has no cervical adenopathy.  Neurological: He is alert and oriented to person, place, and time. Gait normal.  Skin: Skin is warm and dry.    Results for orders placed or performed in visit on 06/16/17  CBC with Differential  Result Value Ref Range   WBC 5.1 3.4 - 10.8 x10E3/uL   RBC 5.53 4.14 - 5.80 x10E6/uL   Hemoglobin 14.8 13.0 - 17.7 g/dL   Hematocrit 16.145.1 09.637.5 - 51.0 %   MCV 82 79 - 97 fL   MCH 26.8 26.6 - 33.0 pg   MCHC 32.8 31.5 - 35.7 g/dL   RDW 04.516.8 (H) 40.912.3 - 81.115.4 %   Platelets 188 150 - 379 x10E3/uL   Neutrophils 51 Not Estab. %   Lymphs 37 Not Estab. %   Monocytes 10 Not Estab. %   Eos 2 Not Estab. %   Basos 0 Not Estab. %   Neutrophils Absolute 2.6 1.4 - 7.0 x10E3/uL   Lymphocytes Absolute 1.9 0.7 - 3.1 x10E3/uL   Monocytes Absolute 0.5 0.1 - 0.9 x10E3/uL   EOS (ABSOLUTE) 0.1 0.0 - 0.4 x10E3/uL   Basophils Absolute 0.0 0.0 - 0.2 x10E3/uL   Immature Granulocytes 0 Not Estab. %   Immature Grans (Abs) 0.0 0.0 - 0.1 x10E3/uL  Comprehensive metabolic panel  Result Value Ref Range   Glucose 89 65 - 99 mg/dL   BUN 9 6 - 24 mg/dL   Creatinine, Ser 9.140.89 0.76 - 1.27 mg/dL   GFR calc non Af Amer 105 >59 mL/min/1.73   GFR calc Af Amer 122 >59 mL/min/1.73   BUN/Creatinine Ratio 10 9 - 20   Sodium 140 134 - 144 mmol/L   Potassium 4.1 3.5 - 5.2 mmol/L   Chloride 101 96 - 106 mmol/L   CO2 23 20 - 29 mmol/L   Calcium 9.7 8.7 - 10.2  mg/dL   Total Protein 7.5 6.0 - 8.5 g/dL   Albumin 4.6 3.5 - 5.5 g/dL   Globulin, Total 2.9 1.5 - 4.5 g/dL   Albumin/Globulin Ratio 1.6 1.2 - 2.2   Bilirubin Total 0.5 0.0 - 1.2 mg/dL   Alkaline Phosphatase 103 39 - 117 IU/L   AST 25 0 - 40 IU/L   ALT 17 0 - 44 IU/L  TSH  Result Value Ref Range   TSH 0.616 0.450 - 4.500 uIU/mL  HIV antibody (with reflex)  Result Value Ref Range   HIV Screen 4th Generation wRfx Non Reactive Non Reactive  POC Hemoccult Bld/Stl (3-Cd Home Screen)  Result Value Ref Range   Card #1 Date 06/19/2017    Fecal Occult Blood, POC Negative Negative   Card #2 Date 06/20/2017    Card #2 Fecal Occult Blod, POC Negative    Card #3 Date 06/21/2017    Card #3 Fecal Occult Blood, POC Negative      ASSESSMENT and PLAN:  1. Annual physical exam Patient overall doing well. Mood wise improving. Discussed healthy lifestyle.   2. Lateral epicondylitis of right elbow Discussed conservative measures. Patient educational handout given.     Myles Lipps, MD Primary Care at [facility] [address] [place], [place] [address] Ph.  [phone] Fax [phone]

## 2017-06-23 NOTE — Patient Instructions (Addendum)
IF you received an x-ray today, you will receive an invoice from [facility]. Please contact [facility] at [phone] with questions or concerns regarding your invoice.   IF you received labwork today, you will receive an invoice from [facility]. Please contact [facility] at [phone] with questions or concerns regarding your invoice.   Our billing staff will not be able to assist you with questions regarding bills from these companies.  You will be contacted with the lab results as soon as they are available. The fastest way to get your results is to activate your My Chart account. Instructions are located on the last page of this paperwork. If you have not heard from us regarding the results in 2 weeks, please contact this office.     Tennis Elbow Rehab Ask your health care provider which exercises are safe for you. Do exercises exactly as told by your health care provider and adjust them as directed. It is normal to feel mild stretching, pulling, tightness, or discomfort as you do these exercises, but you should stop right away if you feel sudden pain or your pain gets worse. Do not begin these exercises until told by your health care provider. Stretching and range of motion exercises These exercises warm up your muscles and joints and improve the movement and flexibility of your elbow. These exercises also help to relieve pain, numbness, and tingling. Exercise A: Wrist extensor stretch 1. Extend your left / right elbow with your fingers pointing down. 2. Gently pull the palm of your left / right hand toward you until you feel a gentle stretch on the top of your forearm. 3. To increase the stretch, push your left / right hand toward the outer edge or pinkie side of your forearm. 4. Hold this position for __________ seconds. Repeat __________ times. Complete this exercise __________ times a day. If directed by your health care provider, repeat this stretch except do  it with a bent elbow this time. Exercise B: Wrist flexor stretch  1. Extend your left / right elbow and turn your palm upward. 2. Gently pull your left / right palm and fingertips back so your wrist extends and your fingers point more toward the ground. 3. You should feel a gentle stretch on the inside of your forearm. 4. Hold this position for __________ seconds. Repeat __________ times. Complete this exercise __________ times a day. If directed by your health care provider, repeat this stretch except do it with a bent elbow this time. Strengthening exercises These exercises build strength and endurance in your elbow. Endurance is the ability to use your muscles for a long time, even after they get tired. Exercise C: Wrist extensors  1. Sit with your left / right forearm palm-down and fully supported on a table or countertop. Your elbow should be resting below the height of your shoulder. 2. Let your left / right wrist extend over the edge of the surface. 3. Loosely hold a __________ weight or a piece of rubber exercise band or tubing in your left / right hand. Slowly curl your left / right hand up toward your forearm. If you are using band or tubing, hold the band or tubing in place with your other hand to provide resistance. 4. Hold this position for __________ seconds. 5. Slowly return to the starting position. Repeat __________ times. Complete this exercise __________ times a day. Exercise D: Radial deviators  1. Stand with a __________ weight in your left / righthand. Or,  sit while holding a rubber exercise band or tubing with your other arm supported on a table or countertop. Position your hand so your thumb is on top. 2. Raise your hand upward in front of you so your thumb travels toward your forearm, or pull up on the rubber tubing. 3. Hold this position for __________ seconds. 4. Slowly return to the starting position. Repeat __________ times. Complete this exercise __________  times a day. Exercise E: Eccentric wrist extensors 1. Sit with your left / right forearm palm-down and fully supported on a table or countertop. Your elbow should be resting below the height of your shoulder. 2. If told by your health care provider, hold a __________ weight in your hand. 3. Let your left / right wrist extend over the edge of the surface. 4. Use your other hand to lift up your left / right hand toward your forearm. Keep your forearm on the table. 5. Using only the muscles in your left / right hand, slowly lower your hand back down to the starting position. Repeat __________ times. Complete this exercise __________ times a day. This information is not intended to replace advice given to you by your health care provider. Make sure you discuss any questions you have with your health care provider. Document Released: 09/27/2005 Document Revised: 06/02/2016 Document Reviewed: 06/26/2015 Elsevier Interactive Patient Education  2018 [employer].  Tennis Elbow Tennis elbow (lateral epicondylitis) is inflammation of the outer tendons of your forearm close to your elbow. Your tendons attach your muscles to your bones. The outer tendons of your forearm are used to extend your wrist, and they attach on the outside part of your elbow. Tennis elbow is often found in people who play tennis, but anyone may get the condition from repeatedly extending the wrist or turning the forearm. What are the causes? This condition is caused by repeatedly extending your wrist and using your hands. It can result from sports or work that requires repetitive forearm movements. Tennis elbow may also be caused by an injury. What increases the risk? You have a higher risk of developing tennis elbow if you play tennis or another racquet sport. You also have a higher risk if you frequently use your hands for work. This condition is also more likely to develop in:  Musicians.  Carpenters, painters, and  plumbers.  Cooks.  Cashiers.  People who work in [employer].  Holiday representative workers.  Butchers.  People who use computers.  What are the signs or symptoms? Symptoms of this condition include:  Pain and tenderness in your forearm and the outer part of your elbow. You may only feel the pain when you use your arm, or you may feel it even when you are not using your arm.  A burning feeling that runs from your elbow through your arm.  Weak grip in your hands.  How is this diagnosed? This condition may be diagnosed by medical history and physical exam. You may also have other tests, including:  X-rays.  MRI.  How is this treated? Your health care provider will recommend lifestyle adjustments, such as resting and icing your arm. Treatment may also include:  Medicines for inflammation. This may include shots of cortisone if your pain continues.  Physical therapy. This may include massage or exercises.  An elbow brace.  Surgery may eventually be recommended if your pain does not go away with treatment. Follow these instructions at home: Activity  Rest your elbow and wrist as directed by your health  care provider. Try to avoid any activities that caused the problem until your health care provider says that you can do them again.  If a physical therapist teaches you exercises, do all of them as directed.  If you lift an object, lift it with your palm facing upward. This lowers the stress on your elbow. Lifestyle  If your tennis elbow is caused by sports, check your equipment and make sure that: ? You are using it correctly. ? It is the best fit for you.  If your tennis elbow is caused by work, take breaks frequently, if you are able. Talk with your manager about how to best perform tasks in a way that is safe. ? If your tennis elbow is caused by computer use, talk with your manager about any changes that can be made to your work environment. General instructions  If  directed, apply ice to the painful area: ? Put ice in a plastic bag. ? Place a towel between your skin and the bag. ? Leave the ice on for 20 minutes, 2-3 times per day.  Take medicines only as directed by your health care provider.  If you were given a brace, wear it as directed by your health care provider.  Keep all follow-up visits as directed by your health care provider. This is important. Contact a health care provider if:  Your pain does not get better with treatment.  Your pain gets worse.  You have numbness or weakness in your forearm, hand, or fingers. This information is not intended to replace advice given to you by your health care provider. Make sure you discuss any questions you have with your health care provider. Document Released: 09/27/2005 Document Revised: 05/27/2016 Document Reviewed: 09/23/2014 Elsevier Interactive Patient Education  [employer].

## 2017-10-15 ENCOUNTER — Encounter: Payer: Self-pay | Admitting: Family Medicine

## 2017-10-15 ENCOUNTER — Other Ambulatory Visit: Payer: Self-pay

## 2017-10-15 ENCOUNTER — Ambulatory Visit (INDEPENDENT_AMBULATORY_CARE_PROVIDER_SITE_OTHER): Payer: BLUE CROSS/BLUE SHIELD

## 2017-10-15 ENCOUNTER — Ambulatory Visit: Payer: BLUE CROSS/BLUE SHIELD | Admitting: Family Medicine

## 2017-10-15 VITALS — BP 122/84 | HR 94 | Temp 98.5°F | Ht 70.0 in | Wt 198.0 lb

## 2017-10-15 DIAGNOSIS — Z23 Encounter for immunization: Secondary | ICD-10-CM | POA: Diagnosis not present

## 2017-10-15 DIAGNOSIS — F17200 Nicotine dependence, unspecified, uncomplicated: Secondary | ICD-10-CM

## 2017-10-15 DIAGNOSIS — R634 Abnormal weight loss: Secondary | ICD-10-CM

## 2017-10-15 MED ORDER — NICOTINE 14 MG/24HR TD PT24: 14.0000 mg | MEDICATED_PATCH | Freq: Every day | TRANSDERMAL | 0 refills | Status: DC

## 2017-10-15 NOTE — Patient Instructions (Signed)
     IF you received an x-ray today, you will receive an invoice from [facility]. Please contact [facility] at [phone] with questions or concerns regarding your invoice.   IF you received labwork today, you will receive an invoice from [facility]. Please contact [facility] at [phone] with questions or concerns regarding your invoice.   Our billing staff will not be able to assist you with questions regarding bills from these companies.  You will be contacted with the lab results as soon as they are available. The fastest way to get your results is to activate your My Chart account. Instructions are located on the last page of this paperwork. If you have not heard from us regarding the results in 2 weeks, please contact this office.     

## 2017-10-15 NOTE — Progress Notes (Signed)
1/5/201911:53 AM  Andrew Gates 06/26/1976, 42 y.o. male [id number]  Chief Complaint  Patient presents with  . Weight Loss    ALSO WANTS TO BE CHK FOR DIABETES, TAKING MUSCLE INHANCEMENT VITAMINS. WANTS PATCH TO STOP SMOKING    HPI:   Patient is a 42 y.o. male who presents today for concerns of continued weight loss. He has gone down from a size 40 to 34. His appetite has returned and diarrhea has resolved. He is doing weights and calisthenics, has been doing muscle milk once a day. He feels that he is eating well enough and not exercising enough to keep losing weight. He also feels that he is not gaining much muscle mass despite his efforts. He always feels tired and thirsty despite drinking 4-5 water bottles a day.   He also wants to quit smoking, cut back to about 10 cigs a day. Interested in patches. Has tried nicotine gum in the past wo results.   He is also cutting back on his beer drinking.   Initial workup; CBC. CMP, TSH, FOBTIA normal  [facility] 2/9 10/15/2017 06/16/2017  Decreased Interest 0 0  Down, Depressed, Hopeless - 1  PHQ - 2 Score 0 1  Altered sleeping - 1  Tired, decreased energy - 1  Change in appetite - 2  Feeling bad or failure about yourself  - 0  Trouble concentrating - 0  Moving slowly or fidgety/restless - 0  Suicidal thoughts - 0  PHQ-9 Score - 5  Difficult doing work/chores - Not difficult at all    No Known Allergies  Prior to Admission medications   Medication Sig Start Date End Date Taking? Authorizing Provider  acetaminophen (TYLENOL) 325 MG tablet Take 2 tablets (650 mg total) by mouth every 4 (four) hours as needed for mild pain or moderate pain (or Fever >/= 101). Patient not taking: Reported on 10/15/2017 04/09/15   Sherrie GeorgeJennings, Willard, PA-C  ibuprofen (MOTRIN IB) 200 MG tablet You can take 2-3 tablets every 6 hours as needed for pain. Patient not taking: Reported on 10/15/2017 04/09/15   Sherrie GeorgeJennings, Willard, PA-C    Past Medical  History:  Diagnosis Date  . Hemorrhoids     Past Surgical History:  Procedure Laterality Date  . EYE SURGERY    . INCISION AND DRAINAGE PERIRECTAL ABSCESS  09/22/11   Done at the bedside in the ER .  Marland Kitchen. INCISION AND DRAINAGE PERIRECTAL ABSCESS  03/30/2012   Procedure: IRRIGATION AND DEBRIDEMENT PERIRECTAL ABSCESS;  Surgeon: Lodema PilotBrian Layton, DO;  Location: [facility];  Service: General;  Laterality: N/A;  . INCISION AND DRAINAGE PERIRECTAL ABSCESS N/A 04/08/2015   Procedure: IRRIGATION AND DEBRIDEMENT PERIRECTAL ABSCESS;  Surgeon: Romie LeveeAlicia Thomas, MD;  Location: [facility];  Service: General;  Laterality: N/A;    Social History   Tobacco Use  . Smoking status: Current Every Day Smoker    Packs/day: 0.50    Types: Cigarettes  . Smokeless tobacco: Never Used  Substance Use Topics  . Alcohol use: Yes    Alcohol/week: 2.4 oz    Types: 4 Cans of beer per week    Comment: occasional    Family History  Problem Relation Age of Onset  . Hypertension Mother   . Cancer Paternal Grandmother     Review of Systems  Constitutional: Positive for malaise/fatigue and weight loss. Negative for chills, diaphoresis and fever.  HENT: Negative for congestion, ear pain and sore throat.   Eyes: Negative for blurred vision.  Respiratory: Negative for cough, hemoptysis and shortness of breath.   Cardiovascular: Negative for chest pain, palpitations and leg swelling.  Gastrointestinal: Negative for abdominal pain, blood in stool, constipation, diarrhea, melena, nausea and vomiting.  Genitourinary: Negative for dysuria and hematuria.  Musculoskeletal: Negative for myalgias.  Neurological: Negative for dizziness, speech change, focal weakness and headaches.  Endo/Heme/Allergies: Positive for polydipsia.  Psychiatric/Behavioral: Negative for depression. The patient is nervous/anxious.      OBJECTIVE:  Blood pressure 122/84, pulse 94, temperature 98.5 F (36.9 C), temperature source Oral, height 5\' 10"  (1.778  m), weight 198 lb (89.8 kg), SpO2 98 %.  Wt Readings from Last 3 Encounters:  10/15/17 198 lb (89.8 kg)  06/23/17 202 lb 9.6 oz (91.9 kg)  06/16/17 202 lb 12.8 oz (92 kg)    Physical Exam  Constitutional: He is oriented to person, place, and time and well-developed, well-nourished, and in no distress.  HENT:  Head: Normocephalic and atraumatic.  Right Ear: Hearing, tympanic membrane, external ear and ear canal normal.  Left Ear: Hearing, tympanic membrane, external ear and ear canal normal.  Mouth/Throat: Oropharynx is clear and moist. No oropharyngeal exudate.  Eyes: Conjunctivae and EOM are normal. Pupils are equal, round, and reactive to light.  Neck: Neck supple. No thyromegaly present.  Cardiovascular: Normal rate, regular rhythm, normal heart sounds and intact distal pulses. Exam reveals no gallop and no friction rub.  No murmur heard. Pulmonary/Chest: Effort normal and breath sounds normal. He has no wheezes. He has no rales.  Abdominal: Soft. Bowel sounds are normal. He exhibits no distension and no mass. There is no tenderness.  Musculoskeletal: Normal range of motion. He exhibits no edema.  Lymphadenopathy:    He has no cervical adenopathy.  Neurological: He is alert and oriented to person, place, and time. He has normal reflexes. No cranial nerve deficit. Gait normal.  Skin: Skin is warm and dry.  Psychiatric: Mood and affect normal.   Dg Chest 2 View  Result Date: 10/15/2017 CLINICAL DATA:  Weight loss, smoker EXAM: CHEST  2 VIEW COMPARISON:  12/28/2001 FINDINGS: Lungs are clear.  No pleural effusion or pneumothorax. The heart is normal size. Visualized osseous structures are within normal limits. IMPRESSION: No evidence of acute cardiopulmonary disease. Electronically Signed   By: Charline Bills M.D.   On: 10/15/2017 12:44    ASSESSMENT and PLAN  1. Loss of weight More than 50% of this 25 minute visit was spent discussing weight loss, healthy lifestyle. Discussed  with patient that weight loss seems appropriate given exercise level, I have asked him to calorie count. He should be consuming around 3200 calories a day to maintain current weight given activity level.  - Hemoglobin A1c - PSA - Testosterone - DG Chest 2 View; Future  2. Tobacco use disorder - nicotine (NICODERM CQ - DOSED IN MG/24 HOURS) 14 mg/24hr patch; Place 1 patch (14 mg total) onto the skin daily.  3. Need for prophylactic vaccination with combined diphtheria-tetanus-pertussis (DTP) vaccine - Td vaccine greater than or equal to 7yo preservative free IM   Return in about 2 weeks (around 10/29/2017).    Myles Lipps, MD Primary Care at [facility] [address] [place], [place] [address] Ph.  [phone] Fax [phone]

## 2017-10-16 LAB — TESTOSTERONE: Testosterone: 584 ng/dL (ref 264–916)

## 2017-10-16 LAB — HEMOGLOBIN A1C
Est. average glucose Bld gHb Est-mCnc: 114 mg/dL
Hgb A1c MFr Bld: 5.6 % (ref 4.8–5.6)

## 2017-10-16 LAB — PSA: Prostate Specific Ag, Serum: 0.7 ng/mL (ref 0.0–4.0)

## 2017-10-18 ENCOUNTER — Encounter: Payer: Self-pay | Admitting: Family Medicine

## 2017-11-01 ENCOUNTER — Other Ambulatory Visit: Payer: Self-pay

## 2017-11-01 ENCOUNTER — Encounter: Payer: Self-pay | Admitting: Family Medicine

## 2017-11-01 ENCOUNTER — Ambulatory Visit: Payer: BLUE CROSS/BLUE SHIELD | Admitting: Family Medicine

## 2017-11-01 VITALS — BP 127/78 | HR 94 | Temp 98.6°F | Ht 76.38 in | Wt 193.0 lb

## 2017-11-01 DIAGNOSIS — R634 Abnormal weight loss: Secondary | ICD-10-CM | POA: Diagnosis not present

## 2017-11-01 DIAGNOSIS — F17200 Nicotine dependence, unspecified, uncomplicated: Secondary | ICD-10-CM

## 2017-11-01 MED ORDER — NICOTINE 21 MG/24HR TD PT24: 21.0000 mg | MEDICATED_PATCH | Freq: Every day | TRANSDERMAL | 1 refills | Status: DC

## 2017-11-01 NOTE — Progress Notes (Signed)
1/22/20198:14 AM  Vaughan BastaJermaine A Edling [birth year], 42 y.o. male [id number]  Chief Complaint  Patient presents with  . Hypertension    BP CHK     HPI:   Patient is a 42 y.o. male  who presents today for routine fu.  Weight loss - 2/2 decreased calorie intake, last visit determined that he needs about ~ 3000 calories a day. He is having difficulty achieving this goal. He is eating about ~ 2000 calories a day..   Smoking cessation not going that well, he is still smoking about 1/2 pack a day with his 14mg  patch. He would like to increase patch dose.  Dep[facility] screen [facility] 11/01/2017 10/15/2017 06/16/2017  Decreased Interest 0 0 0  Down, Depressed, Hopeless 0 - 1  PHQ - 2 Score 0 0 1  Altered sleeping - - 1  Tired, decreased energy - - 1  Change in appetite - - 2  Feeling bad or failure about yourself  - - 0  Trouble concentrating - - 0  Moving slowly or fidgety/restless - - 0  Suicidal thoughts - - 0  PHQ-9 Score - - 5  Difficult doing work/chores - - Not difficult at all    No Known Allergies  Prior to Admission medications   Medication Sig Start Date End Date Taking? Authorizing Provider  nicotine (NICODERM CQ - DOSED IN MG/24 HOURS) 14 mg/24hr patch Place 1 patch (14 mg total) onto the skin daily. 10/15/17  Yes Myles LippsSantiago, Daily Crate M, MD  acetaminophen (TYLENOL) 325 MG tablet Take 2 tablets (650 mg total) by mouth every 4 (four) hours as needed for mild pain or moderate pain (or Fever >/= 101). Patient not taking: Reported on 10/15/2017 04/09/15   Sherrie GeorgeJennings, Willard, PA-C  ibuprofen (MOTRIN IB) 200 MG tablet You can take 2-3 tablets every 6 hours as needed for pain. Patient not taking: Reported on 10/15/2017 04/09/15   Sherrie GeorgeJennings, Willard, PA-C    Past Medical History:  Diagnosis Date  . Hemorrhoids     Past Surgical History:  Procedure Laterality Date  . EYE SURGERY    . INCISION AND DRAINAGE PERIRECTAL ABSCESS  09/22/11   Done at the bedside in the ER .  Marland Kitchen. INCISION AND  DRAINAGE PERIRECTAL ABSCESS  03/30/2012   Procedure: IRRIGATION AND DEBRIDEMENT PERIRECTAL ABSCESS;  Surgeon: Lodema PilotBrian Layton, DO;  Location: [facility];  Service: General;  Laterality: N/A;  . INCISION AND DRAINAGE PERIRECTAL ABSCESS N/A 04/08/2015   Procedure: IRRIGATION AND DEBRIDEMENT PERIRECTAL ABSCESS;  Surgeon: Romie LeveeAlicia Thomas, MD;  Location: [facility];  Service: General;  Laterality: N/A;    Social History   Tobacco Use  . Smoking status: Current Every Day Smoker    Packs/day: 0.50    Types: Cigarettes  . Smokeless tobacco: Never Used  Substance Use Topics  . Alcohol use: Yes    Alcohol/week: 2.4 oz    Types: 4 Cans of beer per week    Comment: occasional    Family History  Problem Relation Age of Onset  . Hypertension Mother   . Cancer Paternal Grandmother     ROS Per hpi  OBJECTIVE:  Blood pressure 127/78, pulse 94, temperature 98.6 F (37 C), temperature source Oral, height 6' 4.38" (1.94 m), weight 193 lb (87.5 kg), SpO2 98 %.  Wt Readings from Last 3 Encounters:  11/01/17 193 lb (87.5 kg)  10/15/17 198 lb (89.8 kg)  06/23/17 202 lb 9.6 oz (91.9 kg)    Physical Exam  Constitutional: He is  oriented to person, place, and time and well-developed, well-nourished, and in no distress.  HENT:  Head: Normocephalic and atraumatic.  Mouth/Throat: Oropharynx is clear and moist.  Eyes: EOM are normal. Pupils are equal, round, and reactive to light.  Neck: Neck supple.  Pulmonary/Chest: Effort normal.  Neurological: He is alert and oriented to person, place, and time. Gait normal.  Skin: Skin is warm and dry.  Psychiatric: Mood and affect normal.  Nursing note and vitals reviewed.     ASSESSMENT and PLAN  1. Loss of weight  2. Tobacco use disorder  Over 50% of this 25 minute visit was spent providing counseling and resources regarding nutrition and smoking cessation. Provided patient with several 3000 calorie meal plan handout, reviewed in detail, pro and cons of each.  Used MI techniques to explore smoking triggers and alternative/strategies.    Other orders - nicotine (NICODERM CQ - DOSED IN MG/24 HOURS) 21 mg/24hr patch; Place 1 patch (21 mg total) onto the skin daily.  Return in about 4 weeks (around 11/29/2017).    Myles Lipps, MD Primary Care at [facility] [address] [place], [place] [address] Ph.  [phone] Fax [phone]

## 2017-11-01 NOTE — Patient Instructions (Signed)
     IF you received an x-ray today, you will receive an invoice from [facility]. Please contact [facility] at [phone] with questions or concerns regarding your invoice.   IF you received labwork today, you will receive an invoice from [facility]. Please contact [facility] at [phone] with questions or concerns regarding your invoice.   Our billing staff will not be able to assist you with questions regarding bills from these companies.  You will be contacted with the lab results as soon as they are available. The fastest way to get your results is to activate your My Chart account. Instructions are located on the last page of this paperwork. If you have not heard from us regarding the results in 2 weeks, please contact this office.        IF you received an x-ray today, you will receive an invoice from [facility]. Please contact [facility] at [phone] with questions or concerns regarding your invoice.   IF you received labwork today, you will receive an invoice from [facility]. Please contact [facility] at [phone] with questions or concerns regarding your invoice.   Our billing staff will not be able to assist you with questions regarding bills from these companies.  You will be contacted with the lab results as soon as they are available. The fastest way to get your results is to activate your My Chart account. Instructions are located on the last page of this paperwork. If you have not heard from us regarding the results in 2 weeks, please contact this office.     

## 2017-12-02 ENCOUNTER — Encounter: Payer: Self-pay | Admitting: Family Medicine

## 2017-12-02 ENCOUNTER — Other Ambulatory Visit: Payer: Self-pay

## 2017-12-02 ENCOUNTER — Ambulatory Visit: Payer: BLUE CROSS/BLUE SHIELD | Admitting: Family Medicine

## 2017-12-02 VITALS — BP 134/83 | HR 87 | Temp 99.4°F | Resp 17 | Ht 72.38 in | Wt 200.2 lb

## 2017-12-02 DIAGNOSIS — F17200 Nicotine dependence, unspecified, uncomplicated: Secondary | ICD-10-CM

## 2017-12-02 DIAGNOSIS — R634 Abnormal weight loss: Secondary | ICD-10-CM

## 2017-12-02 MED ORDER — VARENICLINE TARTRATE 1 MG PO TABS
1.0000 mg | ORAL_TABLET | Freq: Two times a day (BID) | ORAL | 1 refills | Status: AC
Start: 2017-12-02 — End: ?

## 2017-12-02 MED ORDER — VARENICLINE TARTRATE 0.5 MG X 11 & 1 MG X 42 PO MISC: ORAL | 0 refills | Status: AC

## 2017-12-02 NOTE — Progress Notes (Signed)
2/22/20195:44 PM  Andrew Gates 07/23/1976, 42 y.o. male [id number]  Chief Complaint  Patient presents with  . weight loss and tobacco disorder    4 week follow up    HPI:   Patient is a 42 y.o. male who presents today for follow-up  1.weight, originally losing too much weight, has been following 3000 calories/day diet, continues to exercise vigorously, starting to feel more comfortable with current weight  2. Tobacco use, increased patch to , doing better but still smoking despite the patch, cont to have cravings, smoking about 1/2 ppd, has never tried wellbutrin nor chantix  Depression screen [facility] 2/9 12/02/2017 11/01/2017 10/15/2017  Decreased Interest 0 0 0  Down, Depressed, Hopeless 0 0 -  PHQ - 2 Score 0 0 0  Altered sleeping - - -  Tired, decreased energy - - -  Change in appetite - - -  Feeling bad or failure about yourself  - - -  Trouble concentrating - - -  Moving slowly or fidgety/restless - - -  Suicidal thoughts - - -  PHQ-9 Score - - -  Difficult doing work/chores - - -    No Known Allergies  Prior to Admission medications   Medication Sig Start Date End Date Taking? Authorizing Provider  nicotine (NICODERM CQ - DOSED IN MG/24 HOURS) 21 mg/24hr patch Place 1 patch (21 mg total) onto the skin daily. 11/01/17  Yes Myles Lipps, MD  acetaminophen (TYLENOL) 325 MG tablet Take 2 tablets (650 mg total) by mouth every 4 (four) hours as needed for mild pain or moderate pain (or Fever >/= 101). Patient not taking: Reported on 10/15/2017 04/09/15   Sherrie George, PA-C  ibuprofen (MOTRIN IB) 200 MG tablet You can take 2-3 tablets every 6 hours as needed for pain. Patient not taking: Reported on 10/15/2017 04/09/15   Sherrie George, PA-C    Past Medical History:  Diagnosis Date  . Hemorrhoids     Past Surgical History:  Procedure Laterality Date  . EYE SURGERY    . INCISION AND DRAINAGE PERIRECTAL ABSCESS  09/22/11   Done at the bedside in the ER .   Marland Kitchen INCISION AND DRAINAGE PERIRECTAL ABSCESS  03/30/2012   Procedure: IRRIGATION AND DEBRIDEMENT PERIRECTAL ABSCESS;  Surgeon: Lodema Pilot, DO;  Location: [facility];  Service: General;  Laterality: N/A;  . INCISION AND DRAINAGE PERIRECTAL ABSCESS N/A 04/08/2015   Procedure: IRRIGATION AND DEBRIDEMENT PERIRECTAL ABSCESS;  Surgeon: Romie Levee, MD;  Location: [facility];  Service: General;  Laterality: N/A;    Social History   Tobacco Use  . Smoking status: Current Every Day Smoker    Packs/day: 0.50    Types: Cigarettes  . Smokeless tobacco: Never Used  Substance Use Topics  . Alcohol use: Yes    Alcohol/week: 2.4 oz    Types: 4 Cans of beer per week    Comment: occasional    Family History  Problem Relation Age of Onset  . Hypertension Mother   . Cancer Paternal Grandmother     ROS Per hpi  OBJECTIVE:  Blood pressure 134/83, pulse 87, temperature 99.4 F (37.4 C), temperature source Oral, resp. rate 17, height 6' 0.38" (1.838 m), weight 200 lb 3.2 oz (90.8 kg), SpO2 100 %.  Wt Readings from Last 3 Encounters:  12/02/17 200 lb 3.2 oz (90.8 kg)  11/01/17 193 lb (87.5 kg)  10/15/17 198 lb (89.8 kg)    Physical Exam  Constitutional: He is oriented to person, place,  and time and well-developed, well-nourished, and in no distress.  HENT:  Head: Normocephalic and atraumatic.  Mouth/Throat: Oropharynx is clear and moist.  Eyes: EOM are normal. Pupils are equal, round, and reactive to light.  Neck: Neck supple.  Pulmonary/Chest: Effort normal.  Neurological: He is alert and oriented to person, place, and time. Gait normal.  Skin: Skin is warm and dry.  Psychiatric: Mood and affect normal.  Nursing note and vitals reviewed.    ASSESSMENT and PLAN  1. Loss of weight Improved weight with more realistic caloric intake given exercise level. Discussed with patient that if he decreases exercise then calories need to decrease as well. Goal is now to maintain weight.   2. Tobacco  use disorder Has not been able to quit on patches. Discussed treatment options, r/se/b. Decided for chantix.   Other orders - varenicline (CHANTIX PAK) 0.5 MG X 11 & 1 MG X 42 tablet; Take 0.5 mg tab daily for 3 days, then increase to 0.5 mg tab BID for 4 days, then start 1 mg tab BID - varenicline (CHANTIX) 1 MG tablet; Take 1 tablet (1 mg total) by mouth 2 (two) times daily.  Return in about 6 weeks (around 01/13/2018).    Myles LippsIrma M Santiago, MD Primary Care at [facility] [address] [place], [place] [address] Ph.  [phone] Fax [phone]

## 2017-12-02 NOTE — Patient Instructions (Addendum)
1. Start chantix initiation pak 7days before QUIT DATE    IF you received an x-ray today, you will receive an invoice from [facility]. Please contact [facility] at [phone] with questions or concerns regarding your invoice.   IF you received labwork today, you will receive an invoice from [facility]. Please contact [facility] at [phone] with questions or concerns regarding your invoice.   Our billing staff will not be able to assist you with questions regarding bills from these companies.  You will be contacted with the lab results as soon as they are available. The fastest way to get your results is to activate your My Chart account. Instructions are located on the last page of this paperwork. If you have not heard from [place] regarding the results in 2 weeks, please contact this office.     Coping with Quitting Smoking Quitting smoking is a physical and mental challenge. You will face cravings, withdrawal symptoms, and temptation. Before quitting, work with your health care provider to make a plan that can help you cope. Preparation can help you quit and keep you from giving in. How can I cope with cravings? Cravings usually last for 5-10 minutes. If you get through it, the craving will pass. Consider taking the following actions to help you cope with cravings:  Keep your mouth busy: ? Chew sugar-free gum. ? Suck on hard candies or a straw. ? Brush your teeth.  Keep your hands and body busy: ? Immediately change to a different activity when you feel a craving. ? Squeeze or play with a ball. ? Do an activity or a hobby, like making bead jewelry, practicing needlepoint, or working with wood. ? Mix up your normal routine. ? Take a short exercise break. Go for a quick walk or run up and down stairs. ? Spend time in public places where smoking is not allowed.  Focus on doing something kind or helpful for someone else.  Call a friend or family member to talk during  a craving.  Join a support group.  Call a quit line, such as 1-800-QUIT-NOW.  Talk with your health care provider about medicines that might help you cope with cravings and make quitting easier for you.  How can I deal with withdrawal symptoms? Your body may experience negative effects as it tries to get used to not having nicotine in the system. These effects are called withdrawal symptoms. They may include:  Feeling hungrier than normal.  Trouble concentrating.  Irritability.  Trouble sleeping.  Feeling depressed.  Restlessness and agitation.  Craving a cigarette.  To manage withdrawal symptoms:  Avoid places, people, and activities that trigger your cravings.  Remember why you want to quit.  Get plenty of sleep.  Avoid coffee and other caffeinated drinks. These may worsen some of your symptoms.  How can I handle social situations? Social situations can be difficult when you are quitting smoking, especially in the first few weeks. To manage this, you can:  Avoid parties, bars, and other social situations where people might be smoking.  Avoid alcohol.  Leave right away if you have the urge to smoke.  Explain to your family and friends that you are quitting smoking. Ask for understanding and support.  Plan activities with friends or family where smoking is not an option.  What are some ways I can cope with stress? Wanting to smoke may cause stress, and stress can make you want to smoke. Find ways to manage your stress. Relaxation techniques can  help. For example:  Breathe slowly and deeply, in through your nose and out through your mouth.  Listen to soothing, relaxing music.  Talk with a family member or friend about your stress.  Light a candle.  Soak in a bath or take a shower.  Think about a peaceful place.  What are some ways I can prevent weight gain? Be aware that many people gain weight after they quit smoking. However, not everyone does. To keep  from gaining weight, have a plan in place before you quit and stick to the plan after you quit. Your plan should include:  Having healthy snacks. When you have a craving, it may help to: ? Eat plain popcorn, crunchy carrots, celery, or other cut vegetables. ? Chew sugar-free gum.  Changing how you eat: ? Eat small portion sizes at meals. ? Eat 4-6 small meals throughout the day instead of 1-2 large meals a day. ? Be mindful when you eat. Do not watch television or do other things that might distract you as you eat.  Exercising regularly: ? Make time to exercise each day. If you do not have time for a long workout, do short bouts of exercise for 5-10 minutes several times a day. ? Do some form of strengthening exercise, like weight lifting, and some form of aerobic exercise, like running or swimming.  Drinking plenty of water or other low-calorie or no-calorie drinks. Drink 6-8 glasses of water daily, or as much as instructed by your health care provider.  Summary  Quitting smoking is a physical and mental challenge. You will face cravings, withdrawal symptoms, and temptation to smoke again. Preparation can help you as you go through these challenges.  You can cope with cravings by keeping your mouth busy (such as by chewing gum), keeping your body and hands busy, and making calls to family, friends, or a helpline for people who want to quit smoking.  You can cope with withdrawal symptoms by avoiding places where people smoke, avoiding drinks with caffeine, and getting plenty of rest.  Ask your health care provider about the different ways to prevent weight gain, avoid stress, and handle social situations. This information is not intended to replace advice given to you by your health care provider. Make sure you discuss any questions you have with your health care provider. Document Released: 09/24/2016 Document Revised: 09/24/2016 Document Reviewed: 09/24/2016 Elsevier Interactive Patient  Education  [employer]2018 [employer]

## 2019-06-28 IMAGING — DX DG CHEST 2V
2 series · 2 of 2 positions shown · non-contrast
Comparison: 12/28/2001

CLINICAL DATA: Weight loss, smoker

EXAM:
CHEST  2 VIEW

[chest pa]
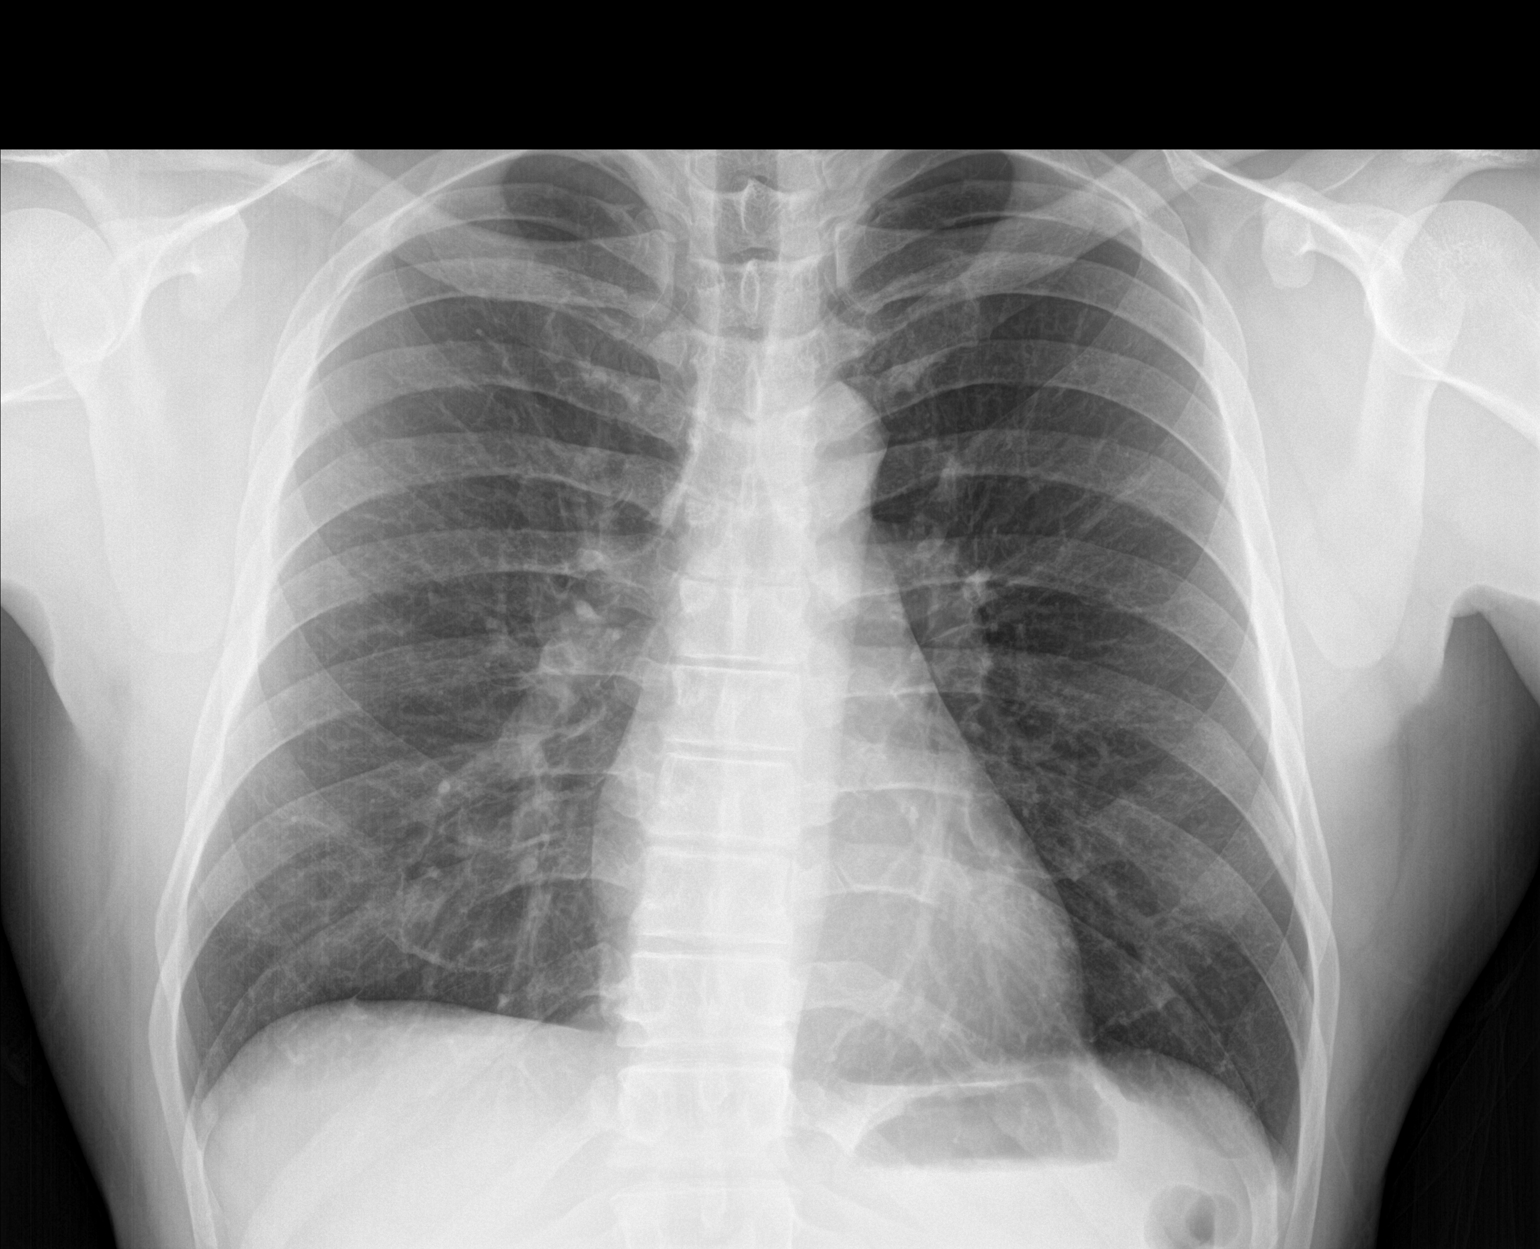

[chest lat]
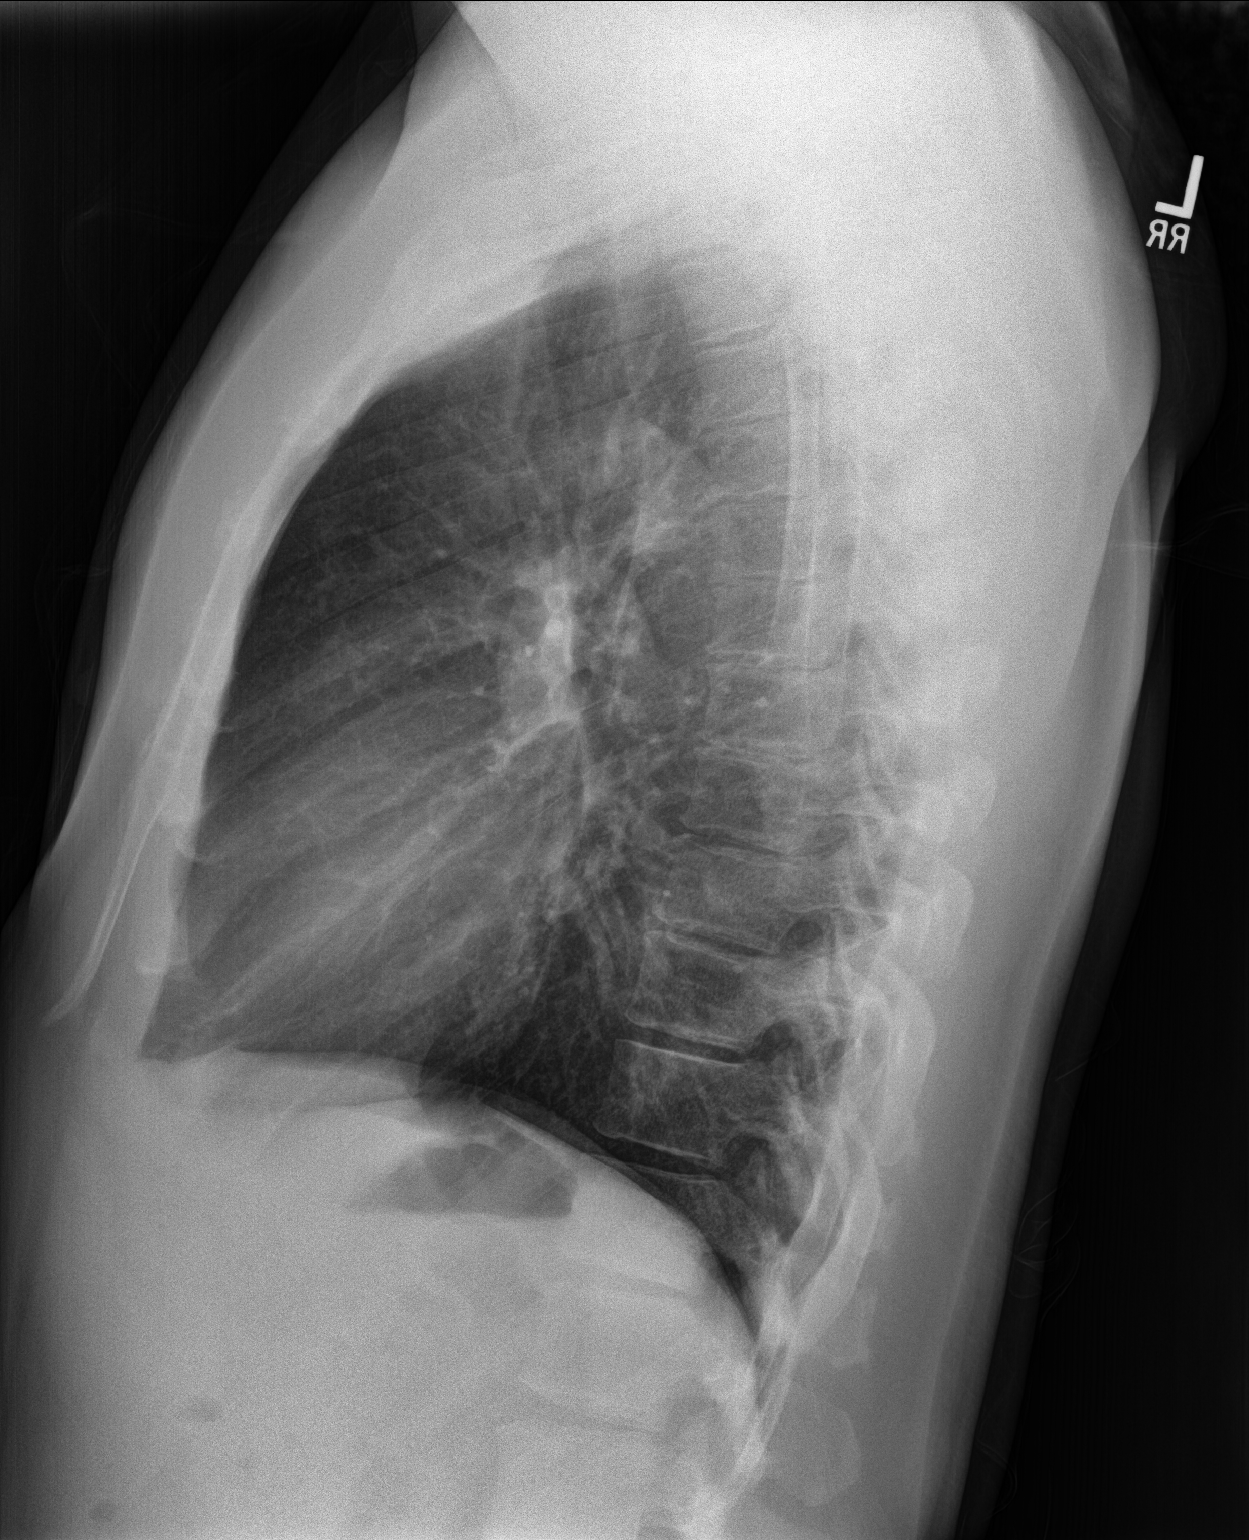

[2 of 2 positions shown; findings below may reference images not displayed]

FINDINGS: Lungs are clear.  No pleural effusion or pneumothorax.

The heart is normal size.

Visualized osseous structures are within normal limits.
IMPRESSION: No evidence of acute cardiopulmonary disease.

## 2019-07-30 ENCOUNTER — Ambulatory Visit: Payer: BLUE CROSS/BLUE SHIELD

## 2019-07-30 ENCOUNTER — Other Ambulatory Visit: Payer: Self-pay
